# Patient Record
Sex: Female | Born: 1970 | Race: White | Hispanic: No | Smoking: Never smoker
Health system: Southern US, Community
[De-identification: ages and names within clinical notes are randomized; demographics above are authoritative.]

## PROBLEM LIST (undated history)

## (undated) DIAGNOSIS — I10 Essential (primary) hypertension: Secondary | ICD-10-CM

## (undated) DIAGNOSIS — T7840XA Allergy, unspecified, initial encounter: Secondary | ICD-10-CM

## (undated) HISTORY — DX: Essential (primary) hypertension: I10

## (undated) HISTORY — PX: TONSILECTOMY, ADENOIDECTOMY, BILATERAL MYRINGOTOMY AND TUBES: SHX2538

## (undated) HISTORY — DX: Allergy, unspecified, initial encounter: T78.40XA

---

## 2012-03-25 ENCOUNTER — Encounter: Payer: Self-pay | Admitting: Physician Assistant

## 2012-03-25 ENCOUNTER — Ambulatory Visit (INDEPENDENT_AMBULATORY_CARE_PROVIDER_SITE_OTHER): Payer: 59 | Admitting: Physician Assistant

## 2012-03-25 VITALS — BP 135/88 | HR 74 | Temp 98.0°F | Resp 16 | Ht 64.5 in | Wt 283.0 lb

## 2012-03-25 DIAGNOSIS — Z23 Encounter for immunization: Secondary | ICD-10-CM

## 2012-03-25 DIAGNOSIS — I10 Essential (primary) hypertension: Secondary | ICD-10-CM

## 2012-03-25 DIAGNOSIS — Z0000 Encounter for general adult medical examination without abnormal findings: Secondary | ICD-10-CM

## 2012-03-25 DIAGNOSIS — Z01419 Encounter for gynecological examination (general) (routine) without abnormal findings: Secondary | ICD-10-CM

## 2012-03-25 LAB — CBC WITH DIFFERENTIAL/PLATELET
Eosinophils Relative: 3 % (ref 0–5)
HCT: 43.2 % (ref 36.0–46.0)
Hemoglobin: 13.8 g/dL (ref 12.0–15.0)
Lymphocytes Relative: 30 % (ref 12–46)
Lymphs Abs: 2.3 10*3/uL (ref 0.7–4.0)
MCV: 85 fL (ref 78.0–100.0)
Monocytes Absolute: 0.4 10*3/uL (ref 0.1–1.0)
Monocytes Relative: 6 % (ref 3–12)
RBC: 5.08 MIL/uL (ref 3.87–5.11)
WBC: 7.8 10*3/uL (ref 4.0–10.5)

## 2012-03-25 LAB — POCT URINALYSIS DIPSTICK
Bilirubin, UA: NEGATIVE
Glucose, UA: NEGATIVE
Leukocytes, UA: NEGATIVE
Nitrite, UA: NEGATIVE
Urobilinogen, UA: 0.2

## 2012-03-25 LAB — TSH: TSH: 3.487 u[IU]/mL (ref 0.350–4.500)

## 2012-03-25 LAB — COMPREHENSIVE METABOLIC PANEL
ALT: 12 U/L (ref 0–35)
BUN: 10 mg/dL (ref 6–23)
CO2: 25 mEq/L (ref 19–32)
Calcium: 8.8 mg/dL (ref 8.4–10.5)
Chloride: 103 mEq/L (ref 96–112)
Creat: 0.95 mg/dL (ref 0.50–1.10)
Glucose, Bld: 83 mg/dL (ref 70–99)

## 2012-03-25 LAB — LIPID PANEL: HDL: 39 mg/dL — ABNORMAL LOW (ref >39)

## 2012-03-25 MED ORDER — LEVONORGESTREL-ETHINYL ESTRAD 0.1-20 MG-MCG PO TABS: 1.0000 | ORAL_TABLET | Freq: Every day | ORAL | Status: DC

## 2012-03-25 MED ORDER — ATENOLOL 50 MG PO TABS: 50.0000 mg | ORAL_TABLET | Freq: Every day | ORAL | Status: DC

## 2012-03-25 MED ORDER — ALPRAZOLAM 0.25 MG PO TABS: 0.2500 mg | ORAL_TABLET | Freq: Every evening | ORAL | Status: DC | PRN

## 2012-03-25 NOTE — Progress Notes (Signed)
  Subjective:    Patient ID: Jamie Marks, female    DOB: May 14, 1971, 41 y.o.   MRN: [id number]  HPI 41 yo CF here for CPE.  She has a fear of thunderstorms and needs a xanax rx which she uses very infrequently.  C/o hot flashes and irritability.  See scanned in form.  Review of Systems  All other systems reviewed and are negative.       Objective:   Physical Exam  Nursing note and vitals reviewed. Constitutional: She is oriented to person, place, and time. She appears well-developed and well-nourished.  HENT:  Head: Normocephalic and atraumatic.  Eyes: Conjunctivae and EOM are normal. Pupils are equal, round, and reactive to light. No scleral icterus.  Neck: Normal range of motion. Neck supple. No thyromegaly present.  Cardiovascular: Normal rate, regular rhythm, normal heart sounds and intact distal pulses.   Pulmonary/Chest: Effort normal and breath sounds normal. Right breast exhibits no inverted nipple, no mass, no nipple discharge, no skin change and no tenderness. Left breast exhibits no inverted nipple, no mass, no nipple discharge, no skin change and no tenderness.  Abdominal: Soft.  Genitourinary: Vagina normal and uterus normal. No vaginal discharge found.       Bimanual unremarkable  Musculoskeletal: Normal range of motion.  Lymphadenopathy:    She has no cervical adenopathy.  Neurological: She is alert and oriented to person, place, and time. She displays normal reflexes. No cranial nerve deficit. Coordination normal.  Skin: Skin is warm and dry.  Psychiatric: She has a normal mood and affect. Her behavior is normal.          Assessment & Plan:  CPE Gyn exam-no sex in 5 yrs.  Never had abnormal pap.  Repeat pap in 3 yrs if normal. Obesity-advised healthy diet and exercise.  Start SBE.  She is having MMG every 2 years  Advised considering SSRI or effexor to see if it would help decrease her irritability, but at this time she doesn't want to pursue that.

## 2012-03-26 ENCOUNTER — Encounter: Payer: Self-pay | Admitting: Physician Assistant

## 2012-03-26 DIAGNOSIS — I10 Essential (primary) hypertension: Secondary | ICD-10-CM | POA: Insufficient documentation

## 2012-03-26 LAB — VITAMIN D 25 HYDROXY (VIT D DEFICIENCY, FRACTURES): Vit D, 25-Hydroxy: 44 ng/mL (ref 30–89)

## 2012-03-30 LAB — PAP IG W/ RFLX HPV ASCU

## 2013-02-23 LAB — HM MAMMOGRAPHY: HM Mammogram: NEGATIVE

## 2013-03-02 ENCOUNTER — Encounter: Payer: Self-pay | Admitting: Family Medicine

## 2013-04-23 ENCOUNTER — Other Ambulatory Visit: Payer: Self-pay | Admitting: Physician Assistant

## 2013-04-30 ENCOUNTER — Encounter: Payer: Self-pay | Admitting: Family Medicine

## 2013-04-30 ENCOUNTER — Ambulatory Visit (INDEPENDENT_AMBULATORY_CARE_PROVIDER_SITE_OTHER): Payer: 59 | Admitting: Family Medicine

## 2013-04-30 VITALS — BP 126/84 | HR 63 | Temp 99.0°F | Resp 16 | Ht 64.5 in | Wt 279.8 lb

## 2013-04-30 DIAGNOSIS — Z3009 Encounter for other general counseling and advice on contraception: Secondary | ICD-10-CM

## 2013-04-30 DIAGNOSIS — I10 Essential (primary) hypertension: Secondary | ICD-10-CM

## 2013-04-30 DIAGNOSIS — Z0000 Encounter for general adult medical examination without abnormal findings: Secondary | ICD-10-CM

## 2013-04-30 DIAGNOSIS — Z79899 Other long term (current) drug therapy: Secondary | ICD-10-CM

## 2013-04-30 LAB — TSH: TSH: 1.539 u[IU]/mL (ref 0.350–4.500)

## 2013-04-30 LAB — COMPREHENSIVE METABOLIC PANEL
Albumin: 3.7 g/dL (ref 3.5–5.2)
Alkaline Phosphatase: 73 U/L (ref 39–117)
BUN: 9 mg/dL (ref 6–23)
CO2: 24 mEq/L (ref 19–32)
Glucose, Bld: 86 mg/dL (ref 70–99)
Sodium: 140 mEq/L (ref 135–145)
Total Bilirubin: 0.4 mg/dL (ref 0.3–1.2)
Total Protein: 6.6 g/dL (ref 6.0–8.3)

## 2013-04-30 LAB — POCT URINALYSIS DIPSTICK
Bilirubin, UA: NEGATIVE
Glucose, UA: NEGATIVE
Ketones, UA: NEGATIVE
Nitrite, UA: NEGATIVE
pH, UA: 6.5

## 2013-04-30 LAB — CBC WITH DIFFERENTIAL/PLATELET
Basophils Absolute: 0 10*3/uL (ref 0.0–0.1)
Basophils Relative: 0 % (ref 0–1)
Hemoglobin: 14.2 g/dL (ref 12.0–15.0)
Lymphocytes Relative: 24 % (ref 12–46)
MCHC: 34.7 g/dL (ref 30.0–36.0)
Neutro Abs: 6.2 10*3/uL (ref 1.7–7.7)
Neutrophils Relative %: 69 % (ref 43–77)
RDW: 14.4 % (ref 11.5–15.5)
WBC: 9 10*3/uL (ref 4.0–10.5)

## 2013-04-30 LAB — LIPID PANEL
Cholesterol: 181 mg/dL (ref 0–200)
HDL: 44 mg/dL (ref >39)
Total CHOL/HDL Ratio: 4.1 Ratio

## 2013-04-30 MED ORDER — ATENOLOL 50 MG PO TABS: 50.0000 mg | ORAL_TABLET | Freq: Every day | ORAL | Status: DC

## 2013-04-30 MED ORDER — LEVONORGESTREL-ETHINYL ESTRAD 0.1-20 MG-MCG PO TABS: 1.0000 | ORAL_TABLET | Freq: Every day | ORAL | Status: DC

## 2013-04-30 MED ORDER — ALPRAZOLAM 0.5 MG PO TABS: 0.5000 mg | ORAL_TABLET | Freq: Every evening | ORAL | Status: DC | PRN

## 2013-04-30 NOTE — Progress Notes (Signed)
Subjective:    Patient ID: Jamie Marks, female    DOB: [birth year], 42 y.o.   MRN: [id number] Chief Complaint  Patient presents with  . Annual Exam    w/pap   HPI  Has not had a period since the end of 2009 - occ spots when her periods are due Has been on birth control to regulate her hormones. She had been seeing a gynecologist prior for her pelvics. Had an abnormal pap smear in her early 67s - pre-20s and they were frozen off and all have been normal since.  Has had a mammogram before - first was a 65 and she just had another.  Calcium and vit D occ but forgetful -   Past Medical History  Diagnosis Date  . Allergy    Current Outpatient Prescriptions on File Prior to Visit  Medication Sig Dispense Refill  . calcium-vitamin D (OSCAL WITH D) 500-200 MG-UNIT per tablet Take 1 tablet by mouth daily.       No current facility-administered medications on file prior to visit.   No Known Allergies History reviewed. No pertinent past surgical history. History reviewed. No pertinent family history. History   Social History  . Marital Status: Single    Spouse Name: N/A    Number of Children: N/A  . Years of Education: N/A   Occupational History  . Data Entry/Customer Service    Social History Main Topics  . Smoking status: Never Smoker   . Smokeless tobacco: None  . Alcohol Use: No  . Drug Use: No  . Sexual Activity: None   Other Topics Concern  . None   Social History Narrative   Divorced. Education: [employer].     Review of Systems  Constitutional: Positive for fatigue.  HENT: Negative.   Eyes: Negative.   Respiratory: Negative.   Cardiovascular: Negative.   Gastrointestinal: Negative.   Endocrine: Negative.   Genitourinary: Negative.   Musculoskeletal: Negative.   Skin: Negative.   Allergic/Immunologic: Negative.   Neurological: Negative.   Hematological: Negative.   Psychiatric/Behavioral: Negative.       BP 126/84  Pulse 63  Temp(Src) 99 F (37.2  C) (Oral)  Resp 16  Ht 5' 4.5" (1.638 m)  Wt 279 lb 12.8 oz (126.916 kg)  BMI 47.3 kg/m2  SpO2 98%  LMP 08/18/2008 Objective:   Physical Exam  Constitutional: She is oriented to person, place, and time. She appears well-developed and well-nourished. No distress.  HENT:  Head: Normocephalic and atraumatic.  Right Ear: Tympanic membrane, external ear and ear canal normal.  Left Ear: Tympanic membrane, external ear and ear canal normal.  Nose: Mucosal edema present. No rhinorrhea.  Mouth/Throat: Uvula is midline, oropharynx is clear and moist and mucous membranes are normal. No posterior oropharyngeal erythema.  Eyes: Conjunctivae and EOM are normal. Pupils are equal, round, and reactive to light. Right eye exhibits no discharge. Left eye exhibits no discharge. No scleral icterus.  Neck: Normal range of motion. Neck supple. No thyromegaly present.  Cardiovascular: Normal rate, regular rhythm, normal heart sounds and intact distal pulses.   Pulmonary/Chest: Effort normal and breath sounds normal. No respiratory distress.  Abdominal: Soft. Bowel sounds are normal. There is no tenderness.  Genitourinary: Vagina normal and uterus normal. No breast swelling, tenderness, discharge or bleeding. Cervix exhibits no motion tenderness and no friability. Right adnexum displays no mass and no tenderness. Left adnexum displays no mass and no tenderness.  Musculoskeletal: She exhibits no edema.  Lymphadenopathy:  She has no cervical adenopathy.  Neurological: She is alert and oriented to person, place, and time. She has normal reflexes.  Skin: Skin is warm and dry. She is not diaphoretic. No erythema.  Psychiatric: She has a normal mood and affect. Her behavior is normal.          Assessment & Plan:  Amenorrhea - could be due to OCPs or peri-menopausal but pt does not want to go off the pills to find out as she is stable hormonally.  Continue on yearly mammograms of [facility].    Routine general  medical examination at a health care facility - Plan: Pap IG and HPV (high risk) DNA detection, POCT urinalysis dipstick, Lipid panel, Comprehensive metabolic panel, Vitamin D 25 hydroxy, TSH, CBC with Differential  HTN (hypertension)  Encounter for long-term (current) use of other medications  Other general counseling and advice for contraceptive management - Plan: Pap IG and HPV (high risk) DNA detection  Meds ordered this encounter  Medications  . OVER THE COUNTER MEDICATION    Sig: OTC Allergy-Tec taking  . OVER THE COUNTER MEDICATION    Sig: OTC Sublingual  B 12 2500 mcg taking  . ALPRAZolam (XANAX) 0.5 MG tablet    Sig: Take 1 tablet (0.5 mg total) by mouth at bedtime as needed for anxiety.    Dispense:  30 tablet    Refill:  0  . atenolol (TENORMIN) 50 MG tablet    Sig: Take 1 tablet (50 mg total) by mouth daily.    Dispense:  30 tablet    Refill:  11    .  . levonorgestrel-ethinyl estradiol (AVIANE,ALESSE,LESSINA) 0.1-20 MG-MCG tablet    Sig: Take 1 tablet by mouth daily.    Dispense:  3 Package    Refill:  4

## 2013-04-30 NOTE — Patient Instructions (Signed)

## 2013-05-01 LAB — VITAMIN D 25 HYDROXY (VIT D DEFICIENCY, FRACTURES): Vit D, 25-Hydroxy: 52 ng/mL (ref 30–89)

## 2013-05-03 LAB — PAP IG AND HPV HIGH-RISK

## 2013-05-04 ENCOUNTER — Encounter: Payer: Self-pay | Admitting: Radiology

## 2013-05-15 ENCOUNTER — Other Ambulatory Visit: Payer: Self-pay | Admitting: Physician Assistant

## 2013-09-02 ENCOUNTER — Encounter: Payer: Self-pay | Admitting: *Deleted

## 2014-04-28 ENCOUNTER — Other Ambulatory Visit: Payer: Self-pay | Admitting: Family Medicine

## 2014-05-06 ENCOUNTER — Encounter: Payer: Self-pay | Admitting: Family Medicine

## 2014-05-06 ENCOUNTER — Ambulatory Visit (INDEPENDENT_AMBULATORY_CARE_PROVIDER_SITE_OTHER): Payer: 59 | Admitting: Family Medicine

## 2014-05-06 VITALS — BP 140/82 | HR 82 | Temp 98.9°F | Resp 16 | Ht 64.75 in | Wt 292.8 lb

## 2014-05-06 DIAGNOSIS — Z124 Encounter for screening for malignant neoplasm of cervix: Secondary | ICD-10-CM

## 2014-05-06 DIAGNOSIS — Z0000 Encounter for general adult medical examination without abnormal findings: Secondary | ICD-10-CM

## 2014-05-06 LAB — CBC WITH DIFFERENTIAL/PLATELET
BASOS PCT: 0 % (ref 0–1)
Basophils Absolute: 0 10*3/uL (ref 0.0–0.1)
EOS PCT: 2 % (ref 0–5)
Eosinophils Absolute: 0.2 10*3/uL (ref 0.0–0.7)
HEMATOCRIT: 43.7 % (ref 36.0–46.0)
Hemoglobin: 14.8 g/dL (ref 12.0–15.0)
Lymphocytes Relative: 25 % (ref 12–46)
Lymphs Abs: 2.5 10*3/uL (ref 0.7–4.0)
MCH: 28 pg (ref 26.0–34.0)
MCHC: 33.9 g/dL (ref 30.0–36.0)
MCV: 82.8 fL (ref 78.0–100.0)
MONO ABS: 0.5 10*3/uL (ref 0.1–1.0)
Monocytes Relative: 5 % (ref 3–12)
Neutro Abs: 6.8 10*3/uL (ref 1.7–7.7)
Neutrophils Relative %: 68 % (ref 43–77)
Platelets: 369 10*3/uL (ref 150–400)
RBC: 5.28 MIL/uL — ABNORMAL HIGH (ref 3.87–5.11)
RDW: 14.2 % (ref 11.5–15.5)
WBC: 10 10*3/uL (ref 4.0–10.5)

## 2014-05-06 MED ORDER — ALPRAZOLAM 0.5 MG PO TABS: 0.5000 mg | ORAL_TABLET | Freq: Every evening | ORAL | Status: DC | PRN

## 2014-05-06 MED ORDER — ATENOLOL 50 MG PO TABS: 50.0000 mg | ORAL_TABLET | Freq: Every day | ORAL | Status: DC

## 2014-05-06 NOTE — Patient Instructions (Signed)
Keeping You Healthy  Get These Tests 1. Blood Pressure- Have your blood pressure checked once a year by your health care provider.  Normal blood pressure is 120/80. 2. Weight- Have your body mass index (BMI) calculated to screen for obesity.  BMI is measure of body fat based on height and weight.  You can also calculate your own BMI at https://www.west-esparza.com/www.nhlbisupport.com/bmi/. 3. Cholesterol- Have your cholesterol checked every 5 years starting at age 45 then yearly starting at age 45. 4. Chlamydia, HIV, and other sexually transmitted diseases- Get screened every year until age 45, then within three months of each new sexual provider. 5. Pap Smear- Every 1-3 years; discuss with your health care provider. 6. Mammogram- Every year starting at age 45  Take these medicines  Calcium with Vitamin D-Your body needs 1200 mg of Calcium each day and [phone] IU of Vitamin D daily.  Your body can only absorb 500 mg of Calcium at a time so Calcium must be taken in 2 or 3 divided doses throughout the day.  Multivitamin with folic acid- Once daily if it is possible for you to become pregnant.  Get these Immunizations  Gardasil-Series of three doses; prevents HPV related illness such as genital warts and cervical cancer.  Menactra-Single dose; prevents meningitis.  Tetanus shot- Every 10 years.  Flu shot-Every year.  Take these steps 1. Do not smoke-Your healthcare provider can help you quit.  For tips on how to quit go to www.smokefree.gov or call 1-800 QUITNOW. 2. Be physically active- Exercise 5 days a week for at least 30 minutes.  If you are not already physically active, start slow and gradually work up to 30 minutes of moderate physical activity.  Examples of moderate activity include walking briskly, dancing, swimming, bicycling, etc. 3. Breast Cancer- A self breast exam every month is important for early detection of breast cancer.  For more information and instruction on self breast exams, ask your  healthcare provider or [place].eswww.womenshealth.gov/faq/breast-self-exam.cfm. 4. Eat a healthy diet- Eat a variety of healthy foods such as fruits, vegetables, whole grains, low fat milk, low fat cheeses, yogurt, lean meats, poultry and fish, beans, nuts, tofu, etc.  For more information go to www. Thenutritionsource.org 5. Drink alcohol in moderation- Limit alcohol intake to one drink or less per day. Never drink and drive. 6. Depression- Your emotional health is as important as your physical health.  If you're feeling down or losing interest in things you normally enjoy please talk to your healthcare provider about being screened for depression. 7. Dental visit- Brush and floss your teeth twice daily; visit your dentist twice a year. 8. Eye doctor- Get an eye exam at least every 2 years. 9. Helmet use- Always wear a helmet when riding a bicycle, motorcycle, rollerblading or skateboarding. 10. Safe sex- If you may be exposed to sexually transmitted infections, use a condom. 11. Seat belts- Seat belts can save your live; always wear one. 12. Smoke/Carbon Monoxide detectors- These detectors need to be installed on the appropriate level of your home. Replace batteries at least once a year. 13. Skin cancer- When out in the sun please cover up and use sunscreen 15 SPF or higher. 14. Violence- If anyone is threatening or hurting you, please tell your healthcare provider.     Hemorrhoids Hemorrhoids are swollen veins around the rectum or anus. There are two types of hemorrhoids:   Internal hemorrhoids. These occur in the veins just inside the rectum. They may poke through to the outside  and become irritated and painful.  External hemorrhoids. These occur in the veins outside the anus and can be felt as a painful swelling or hard lump near the anus. CAUSES  Pregnancy.   Obesity.   Constipation or diarrhea.   Straining to have a bowel movement.   Sitting for long periods on the toilet.  Heavy  lifting or other activity that caused you to strain.  Anal intercourse. SYMPTOMS   Pain.   Anal itching or irritation.   Rectal bleeding.   Fecal leakage.   Anal swelling.   One or more lumps around the anus.  DIAGNOSIS  Your caregiver may be able to diagnose hemorrhoids by visual examination. Other examinations or tests that may be performed include:   Examination of the rectal area with a gloved hand (digital rectal exam).   Examination of anal canal using a small tube (scope).   A blood test if you have lost a significant amount of blood.  A test to look inside the colon (sigmoidoscopy or colonoscopy). TREATMENT Most hemorrhoids can be treated at home. However, if symptoms do not seem to be getting better or if you have a lot of rectal bleeding, your caregiver may perform a procedure to help make the hemorrhoids get smaller or remove them completely. Possible treatments include:   Placing a rubber band at the base of the hemorrhoid to cut off the circulation (rubber band ligation).   Injecting a chemical to shrink the hemorrhoid (sclerotherapy).   Using a tool to burn the hemorrhoid (infrared light therapy).   Surgically removing the hemorrhoid (hemorrhoidectomy).   Stapling the hemorrhoid to block blood flow to the tissue (hemorrhoid stapling).  HOME CARE INSTRUCTIONS   Eat foods with fiber, such as whole grains, beans, nuts, fruits, and vegetables. Ask your doctor about taking products with added fiber in them (fibersupplements).  Increase fluid intake. Drink enough water and fluids to keep your urine clear or pale yellow.   Exercise regularly.   Go to the bathroom when you have the urge to have a bowel movement. Do not wait.   Avoid straining to have bowel movements.   Keep the anal area dry and clean. Use wet toilet paper or moist towelettes after a bowel movement.   Medicated creams and suppositories may be used or applied as directed.    Only take over-the-counter or prescription medicines as directed by your caregiver.   Take warm sitz baths for 15-20 minutes, 3-4 times a day to ease pain and discomfort.   Place ice packs on the hemorrhoids if they are tender and swollen. Using ice packs between sitz baths may be helpful.   Put ice in a plastic bag.   Place a towel between your skin and the bag.   Leave the ice on for 15-20 minutes, 3-4 times a day.   Do not use a donut-shaped pillow or sit on the toilet for long periods. This increases blood pooling and pain.  SEEK MEDICAL CARE IF:  You have increasing pain and swelling that is not controlled by treatment or medicine.  You have uncontrolled bleeding.  You have difficulty or you are unable to have a bowel movement.  You have pain or inflammation outside the area of the hemorrhoids. MAKE SURE YOU:  Understand these instructions.  Will watch your condition.  Will get help right away if you are not doing well or get worse. Document Released: 11/01/2000 Document Revised: 10/21/2012 Document Reviewed: 09/08/2012 [facility] Patient Information 2015 [place] [place].  This information is not intended to replace advice given to you by your health care provider. Make sure you discuss any questions you have with your health care provider.

## 2014-05-06 NOTE — Progress Notes (Deleted)
This chart was scribed for Jamie MochaEva N Shaw, MD by Luisa DagoPriscilla Tutu, ED Scribe. This patient was seen in room 25 and the patient's care was started at 2:04 PM.  Subjective:    Patient ID: Jamie Marks, female    DOB: Oct 06, 1971, 43 y.o.   MRN: [id number]  Chief Complaint  Patient presents with   Annual Exam    W/PAP?    HPI HPI Comments: Jamie LawsDeborah Marks is a 43 y.o. female who presents to the [facility] for her annual physical exam.  Pt was last seen on 05/06/2013 by me. She has had no regular periods since 2009 but occasionally has spotting. Pt has been on birth control to regulate her hormones. She did have an abnomal pap smear in the 20's and was treated with surgery. Pt has not had any abnormal pap smears since then. However, she prefers to continue with annual pap smears due to her hx despite changing recs. Mammograms every 2 years since she turned 38. She is on Atenolol daily for her BP and takes Xanax for anxiety as needed but has used less than 30 this past year. Full lab work up with lipids, vitamin D,TSH, CBC, and CMP last yr were all within normal limits. Pt's last tentanus vaccine was in 2013.   Today, pt states that she is doing well today. She states that she is not sexually active in the past 7 years.   Pt states that she has ridges in her fingernails and is concerned. She is concerned that she may be low on calcium. Pt reassured but should take vit D and ca supp daily or get in diet.   Pt states that as she's gotten older she's had regular bowel movements. She states that she goes in the morning and before bed. Pt is concerned that she may still be constipated, because she had a conversation with a friend who told her that "you could still be constipated, even it you are going to the bathroom regularly". Pt states that she's added a stool softener to her diet as needed. She states that sometime ago she had a straining episodes and sometimes when she wipes she  sees some blood.   Pt does not check her BP regularly. She states that she still takes her Atenolol every night.   Patient Active Problem List   Diagnosis Date Noted   HTN (hypertension) 03/26/2012   Past Medical History  Diagnosis Date   Allergy    No past surgical history on file. No Known Allergies Prior to Admission medications   Medication Sig Start Date End Date Taking? Authorizing Provider  ALPRAZolam Prudy Feeler(XANAX) 0.5 MG tablet Take 1 tablet (0.5 mg total) by mouth at bedtime as needed for anxiety. 04/30/13   Jamie MochaEva N Shaw, MD  atenolol (TENORMIN) 50 MG tablet Take 1 tablet (50 mg total) by mouth daily. PATIENT NEEDS OFFICE VISIT FOR ADDITIONAL REFILLS 04/28/14   Jamie MochaEva N Shaw, MD  calcium-vitamin D (OSCAL WITH D) 500-200 MG-UNIT per tablet Take 1 tablet by mouth daily.    Historical Provider, MD  levonorgestrel-ethinyl estradiol (AVIANE,ALESSE,LESSINA) 0.1-20 MG-MCG tablet Take 1 tablet by mouth daily. 04/30/13   Jamie MochaEva N Shaw, MD  OVER THE COUNTER MEDICATION OTC Sublingual  B 12 2500 mcg taking    Historical Provider, MD   History   Social History   Marital Status: Single    Spouse Name: N/A    Number of Children: N/A   Years of Education:  N/A   Occupational History   Data Entry/Customer Service    Social History Main Topics   Smoking status: Never Smoker    Smokeless tobacco: Not on file   Alcohol Use: No   Drug Use: No   Sexual Activity: Not on file   Other Topics Concern   Not on file   Social History Narrative   Divorced. Education: [employer].     Review of Systems  Constitutional: Negative for fatigue and unexpected weight change.  Respiratory: Negative for chest tightness and shortness of breath.   Cardiovascular: Negative for chest pain, palpitations and leg swelling.  Gastrointestinal: Negative for abdominal pain and blood in stool.  Neurological: Negative for dizziness, syncope, light-headedness and headaches.  All other systems reviewed and are  negative.      Objective:   Physical Exam  Nursing note and vitals reviewed. Constitutional: She is oriented to person, place, and time. She appears well-developed and well-nourished. No distress.  HENT:  Head: Normocephalic and atraumatic.  Right Ear: Tympanic membrane normal.  Left Ear: Tympanic membrane normal.  Nose: Nose normal.  Mouth/Throat: Uvula is midline, oropharynx is clear and moist and mucous membranes are normal.  Eyes: Conjunctivae and EOM are normal.  Neck: Normal range of motion. Neck supple. No thyromegaly present.  Cardiovascular: Normal rate, regular rhythm and normal heart sounds.  Exam reveals no gallop and no friction rub.   No murmur heard. Pulmonary/Chest: Effort normal. No respiratory distress. She has no wheezes. She has no rales. She exhibits no tenderness.  Genitourinary: Rectum normal, vagina normal and uterus normal. No breast swelling or tenderness. Pelvic exam was performed with patient supine. There is no tenderness on the right labia. There is no tenderness on the left labia. No vaginal discharge found.  Normal labia. Normal cervix. Normal vagina. Normal uterus. Small healing external hemorrhoid at 4 o'clock. Rectal exam normal.   Musculoskeletal: Normal range of motion.  Lymphadenopathy:    She has no cervical adenopathy.  Neurological: She is alert and oriented to person, place, and time.  Skin: Skin is warm and dry.  Psychiatric: She has a normal mood and affect. Her behavior is normal.   Filed Vitals:   05/06/14 1351  BP: 140/82  Pulse: 82  Temp: 98.9 F (37.2 C)  TempSrc: Oral  Resp: 16  Height: 5' 4.75" (1.645 m)  Weight: 292 lb 12.8 oz (132.813 kg)  SpO2: 99%          Assessment & Plan:   Routine general medical examination at a health care facility - Plan: CBC with Differential, TSH, Lipid panel, Comprehensive metabolic panel  Pap smear for cervical cancer screening - Plan: Pap IG w/ reflex to HPV when ASC-U  Meds ordered  this encounter  Medications   cetirizine (ZYRTEC) 10 MG tablet    Sig: Take 10 mg by mouth daily.   ALPRAZolam (XANAX) 0.5 MG tablet    Sig: Take 1 tablet (0.5 mg total) by mouth at bedtime as needed for anxiety.    Dispense:  30 tablet    Refill:  0   atenolol (TENORMIN) 50 MG tablet    Sig: Take 1 tablet (50 mg total) by mouth daily.    Dispense:  90 tablet    Refill:  3    I personally performed the services described in this documentation, which was scribed in my presence. The recorded information has been reviewed and considered, and addended by me as needed.  Norberto SorensonEva Shaw, MD MPH

## 2014-05-07 LAB — COMPREHENSIVE METABOLIC PANEL
ALBUMIN: 3.9 g/dL (ref 3.5–5.2)
ALK PHOS: 85 U/L (ref 39–117)
ALT: 13 U/L (ref 0–35)
AST: 16 U/L (ref 0–37)
BUN: 7 mg/dL (ref 6–23)
CALCIUM: 9.2 mg/dL (ref 8.4–10.5)
CHLORIDE: 102 meq/L (ref 96–112)
CO2: 26 mEq/L (ref 19–32)
Creat: 0.84 mg/dL (ref 0.50–1.10)
Glucose, Bld: 81 mg/dL (ref 70–99)
POTASSIUM: 4.5 meq/L (ref 3.5–5.3)
Sodium: 138 mEq/L (ref 135–145)
Total Bilirubin: 0.4 mg/dL (ref 0.2–1.2)
Total Protein: 6.9 g/dL (ref 6.0–8.3)

## 2014-05-07 LAB — LIPID PANEL
Cholesterol: 156 mg/dL (ref 0–200)
HDL: 47 mg/dL (ref >39)
LDL CALC: 93 mg/dL (ref 0–99)
Total CHOL/HDL Ratio: 3.3 Ratio
Triglycerides: 81 mg/dL (ref <150)
VLDL: 16 mg/dL (ref 0–40)

## 2014-05-07 LAB — TSH: TSH: 3.114 u[IU]/mL (ref 0.350–4.500)

## 2014-05-10 LAB — PAP IG W/ RFLX HPV ASCU

## 2014-05-16 ENCOUNTER — Telehealth: Payer: Self-pay | Admitting: *Deleted

## 2014-05-16 NOTE — Telephone Encounter (Signed)
Pt called in this morning in regards to lab results from 6/19. Please advise.

## 2014-05-16 NOTE — Telephone Encounter (Signed)
Pt called in regards to results from labs on 6/19. Please advise

## 2014-05-18 ENCOUNTER — Encounter: Payer: Self-pay | Admitting: Family Medicine

## 2014-05-18 NOTE — Telephone Encounter (Signed)
Labs normal, letter sent and report sent to lab pool.

## 2014-05-18 NOTE — Progress Notes (Signed)
This chart was scribed for Sherren Mocha, MD by Luisa Dago, ED Scribe. This patient was seen in room 25 and the patient's care was started at 2:04 PM.  Subjective:    Patient ID: Jamie Marks, female    DOB: 03-17-1971, 43 y.o.   MRN: [id number]  Chief Complaint  Patient presents with  . Annual Exam    W/PAP?    HPI HPI Comments: Jamie Marks is a 43 y.o. female who presents to the [facility] for her annual physical exam.  Pt was last seen on 05/06/2013 by me. She has had no regular periods since 2009 but occasionally has spotting. Pt has been on birth control to regulate her hormones. She did have an abnomal pap smear in the 20's and was treated with surgery. Pt has not had any abnormal pap smears since then. However, she prefers to continue with annual pap smears due to her hx despite changing recs. Mammograms every 2 years since she turned 38. She is on Atenolol daily for her BP and takes Xanax for anxiety as needed but has used less than 30 this past year. Full lab work up with lipids, vitamin D,TSH, CBC, and CMP last yr were all within normal limits. Pt's last tentanus vaccine was in 2013.   Today, pt states that she is doing well today. She states that she is not sexually active in the past 7 years.   Pt states that she has ridges in her fingernails and is concerned. She is concerned that she may be low on calcium. Pt reassured but should take vit D and ca supp daily or get in diet.   Pt states that as she's gotten older she's had regular bowel movements. She states that she goes in the morning and before bed. Pt is concerned that she may still be constipated, because she had a conversation with a friend who told her that "you could still be constipated, even it you are going to the bathroom regularly". Pt states that she's added a stool softener to her diet as needed. She states that sometime ago she had a straining episodes and sometimes when she wipes she  sees some blood.   Pt does not check her BP regularly. She states that she still takes her Atenolol every night.   Patient Active Problem List   Diagnosis Date Noted  . HTN (hypertension) 03/26/2012   Past Medical History  Diagnosis Date  . Allergy   . Hypertension    Past Surgical History  Procedure Laterality Date  . Tonsilectomy, adenoidectomy, bilateral myringotomy and tubes     No Known Allergies Prior to Admission medications   Medication Sig Start Date End Date Taking? Authorizing Provider  ALPRAZolam Prudy Feeler) 0.5 MG tablet Take 1 tablet (0.5 mg total) by mouth at bedtime as needed for anxiety. 04/30/13   Sherren Mocha, MD  atenolol (TENORMIN) 50 MG tablet Take 1 tablet (50 mg total) by mouth daily. PATIENT NEEDS OFFICE VISIT FOR ADDITIONAL REFILLS 04/28/14   Sherren Mocha, MD  calcium-vitamin D (OSCAL WITH D) 500-200 MG-UNIT per tablet Take 1 tablet by mouth daily.    Historical Provider, MD  levonorgestrel-ethinyl estradiol (AVIANE,ALESSE,LESSINA) 0.1-20 MG-MCG tablet Take 1 tablet by mouth daily. 04/30/13   Sherren Mocha, MD  OVER THE COUNTER MEDICATION OTC Sublingual  B 12 2500 mcg taking    Historical Provider, MD   History   Social History  . Marital Status: Single  Spouse Name: N/A    Number of Children: N/A  . Years of Education: N/A   Occupational History  . Data Entry/Customer Service    Social History Main Topics  . Smoking status: Never Smoker   . Smokeless tobacco: Not on file  . Alcohol Use: No  . Drug Use: No  . Sexual Activity: Not on file   Other Topics Concern  . Not on file   Social History Narrative   Divorced. Education: [employer].     Review of Systems  Constitutional: Negative for fatigue and unexpected weight change.  Respiratory: Negative for chest tightness and shortness of breath.   Cardiovascular: Negative for chest pain, palpitations and leg swelling.  Gastrointestinal: Negative for abdominal pain and blood in stool.  Neurological:  Negative for dizziness, syncope, light-headedness and headaches.  All other systems reviewed and are negative.      Objective:   Physical Exam  Nursing note and vitals reviewed. Constitutional: She is oriented to person, place, and time. She appears well-developed and well-nourished. No distress.  HENT:  Head: Normocephalic and atraumatic.  Right Ear: Tympanic membrane normal.  Left Ear: Tympanic membrane normal.  Nose: Nose normal.  Mouth/Throat: Uvula is midline, oropharynx is clear and moist and mucous membranes are normal.  Eyes: Conjunctivae and EOM are normal.  Neck: Normal range of motion. Neck supple. No thyromegaly present.  Cardiovascular: Normal rate, regular rhythm and normal heart sounds.  Exam reveals no gallop and no friction rub.   No murmur heard. Pulmonary/Chest: Effort normal. No respiratory distress. She has no wheezes. She has no rales. She exhibits no tenderness.  Genitourinary: Rectum normal, vagina normal and uterus normal. No breast swelling or tenderness. Pelvic exam was performed with patient supine. There is no tenderness on the right labia. There is no tenderness on the left labia. No vaginal discharge found.  Normal labia. Normal cervix. Normal vagina. Normal uterus. Small healing external hemorrhoid at 4 o'clock. Rectal exam normal.   Musculoskeletal: Normal range of motion.  Lymphadenopathy:    She has no cervical adenopathy.  Neurological: She is alert and oriented to person, place, and time.  Skin: Skin is warm and dry.  Psychiatric: She has a normal mood and affect. Her behavior is normal.   Filed Vitals:   05/06/14 1351  BP: 140/82  Pulse: 82  Temp: 98.9 F (37.2 C)  TempSrc: Oral  Resp: 16  Height: 5' 4.75" (1.645 m)  Weight: 292 lb 12.8 oz (132.813 kg)  SpO2: 99%          Assessment & Plan:   Routine general medical examination at a health care facility - Plan: CBC with Differential, TSH, Lipid panel, Comprehensive metabolic  panel  Pap smear for cervical cancer screening - Plan: Pap IG w/ reflex to HPV when ASC-U Ok to cont w/ annual pap per pt pref.   Meds ordered this encounter  Medications  . cetirizine (ZYRTEC) 10 MG tablet    Sig: Take 10 mg by mouth daily.  Marland Kitchen. ALPRAZolam (XANAX) 0.5 MG tablet    Sig: Take 1 tablet (0.5 mg total) by mouth at bedtime as needed for anxiety.    Dispense:  30 tablet    Refill:  0  . atenolol (TENORMIN) 50 MG tablet    Sig: Take 1 tablet (50 mg total) by mouth daily.    Dispense:  90 tablet    Refill:  3    I personally performed the services described in this documentation,  which was scribed in my presence. The recorded information has been reviewed and considered, and addended by me as needed.  Norberto SorensonEva Leshonda Galambos, MD MPH

## 2014-06-03 ENCOUNTER — Telehealth: Payer: Self-pay

## 2014-06-03 MED ORDER — LEVONORGESTREL-ETHINYL ESTRAD 0.1-20 MG-MCG PO TABS: 1.0000 | ORAL_TABLET | Freq: Every day | ORAL | Status: DC

## 2014-06-03 NOTE — Telephone Encounter (Signed)
Refill sent in for pt- last CPE in June with Dr. Clelia CroftShaw.

## 2014-06-03 NOTE — Telephone Encounter (Signed)
Pt needs a refill on her birth control to [facility] in [place] [id number] Please call pt [phone] , and her extension is  [phone]

## 2014-07-06 ENCOUNTER — Other Ambulatory Visit: Payer: Self-pay | Admitting: Family Medicine

## 2015-04-03 ENCOUNTER — Encounter: Payer: Self-pay | Admitting: *Deleted

## 2015-04-27 ENCOUNTER — Encounter: Payer: Self-pay | Admitting: Family Medicine

## 2015-05-19 ENCOUNTER — Ambulatory Visit (INDEPENDENT_AMBULATORY_CARE_PROVIDER_SITE_OTHER): Payer: 59 | Admitting: Family Medicine

## 2015-05-19 ENCOUNTER — Encounter: Payer: Self-pay | Admitting: Family Medicine

## 2015-05-19 VITALS — BP 138/80 | HR 86 | Temp 98.3°F | Resp 20 | Ht 64.5 in | Wt 285.0 lb

## 2015-05-19 DIAGNOSIS — Z1239 Encounter for other screening for malignant neoplasm of breast: Secondary | ICD-10-CM | POA: Diagnosis not present

## 2015-05-19 DIAGNOSIS — Z124 Encounter for screening for malignant neoplasm of cervix: Secondary | ICD-10-CM

## 2015-05-19 DIAGNOSIS — Z113 Encounter for screening for infections with a predominantly sexual mode of transmission: Secondary | ICD-10-CM

## 2015-05-19 DIAGNOSIS — Z130 Encounter for screening for diseases of the blood and blood-forming organs and certain disorders involving the immune mechanism: Secondary | ICD-10-CM | POA: Diagnosis not present

## 2015-05-19 DIAGNOSIS — Z0000 Encounter for general adult medical examination without abnormal findings: Secondary | ICD-10-CM | POA: Diagnosis not present

## 2015-05-19 DIAGNOSIS — Z1329 Encounter for screening for other suspected endocrine disorder: Secondary | ICD-10-CM | POA: Diagnosis not present

## 2015-05-19 DIAGNOSIS — Z1389 Encounter for screening for other disorder: Secondary | ICD-10-CM

## 2015-05-19 DIAGNOSIS — E669 Obesity, unspecified: Secondary | ICD-10-CM

## 2015-05-19 DIAGNOSIS — I10 Essential (primary) hypertension: Secondary | ICD-10-CM | POA: Diagnosis not present

## 2015-05-19 DIAGNOSIS — Z1383 Encounter for screening for respiratory disorder NEC: Secondary | ICD-10-CM

## 2015-05-19 DIAGNOSIS — Z136 Encounter for screening for cardiovascular disorders: Secondary | ICD-10-CM

## 2015-05-19 LAB — COMPREHENSIVE METABOLIC PANEL
ALBUMIN: 3.8 g/dL (ref 3.5–5.2)
ALT: 26 U/L (ref 0–35)
AST: 19 U/L (ref 0–37)
Alkaline Phosphatase: 70 U/L (ref 39–117)
BILIRUBIN TOTAL: 0.4 mg/dL (ref 0.2–1.2)
BUN: 6 mg/dL (ref 6–23)
CALCIUM: 8.7 mg/dL (ref 8.4–10.5)
CO2: 24 mEq/L (ref 19–32)
CREATININE: 0.88 mg/dL (ref 0.50–1.10)
Chloride: 101 mEq/L (ref 96–112)
Glucose, Bld: 79 mg/dL (ref 70–99)
Potassium: 4.4 mEq/L (ref 3.5–5.3)
Sodium: 139 mEq/L (ref 135–145)
TOTAL PROTEIN: 6.7 g/dL (ref 6.0–8.3)

## 2015-05-19 LAB — LIPID PANEL
Cholesterol: 154 mg/dL (ref 0–200)
HDL: 40 mg/dL — AB (ref >=46)
LDL Cholesterol: 97 mg/dL (ref 0–99)
Total CHOL/HDL Ratio: 3.9 Ratio
Triglycerides: 86 mg/dL (ref <150)
VLDL: 17 mg/dL (ref 0–40)

## 2015-05-19 LAB — POCT WET PREP WITH KOH
CLUE CELLS WET PREP PER HPF POC: NEGATIVE
KOH Prep POC: NEGATIVE
RBC Wet Prep HPF POC: NEGATIVE
Trichomonas, UA: NEGATIVE
WBC Wet Prep HPF POC: NEGATIVE
Yeast Wet Prep HPF POC: NEGATIVE

## 2015-05-19 LAB — POCT URINALYSIS DIPSTICK
Bilirubin, UA: NEGATIVE
GLUCOSE UA: NEGATIVE
Ketones, UA: NEGATIVE
Leukocytes, UA: NEGATIVE
Nitrite, UA: NEGATIVE
PROTEIN UA: NEGATIVE
RBC UA: NEGATIVE
Spec Grav, UA: <=1.005
Urobilinogen, UA: 0.2
pH, UA: 6.5

## 2015-05-19 LAB — CBC
HCT: 42 % (ref 36.0–46.0)
Hemoglobin: 13.8 g/dL (ref 12.0–15.0)
MCH: 28 pg (ref 26.0–34.0)
MCHC: 32.9 g/dL (ref 30.0–36.0)
MCV: 85.4 fL (ref 78.0–100.0)
MPV: 11.3 fL (ref 8.6–12.4)
PLATELETS: 329 10*3/uL (ref 150–400)
RBC: 4.92 MIL/uL (ref 3.87–5.11)
RDW: 14.2 % (ref 11.5–15.5)
WBC: 8.1 10*3/uL (ref 4.0–10.5)

## 2015-05-19 LAB — TSH: TSH: 2.828 u[IU]/mL (ref 0.350–4.500)

## 2015-05-19 MED ORDER — LEVONORGESTREL-ETHINYL ESTRAD 0.1-20 MG-MCG PO TABS: 1.0000 | ORAL_TABLET | Freq: Every day | ORAL | Status: DC

## 2015-05-19 MED ORDER — ALPRAZOLAM 0.5 MG PO TABS: 0.5000 mg | ORAL_TABLET | Freq: Every evening | ORAL | Status: DC | PRN

## 2015-05-19 MED ORDER — ATENOLOL 50 MG PO TABS: 50.0000 mg | ORAL_TABLET | Freq: Every day | ORAL | Status: DC

## 2015-05-19 NOTE — Patient Instructions (Signed)

## 2015-05-19 NOTE — Progress Notes (Signed)
Subjective:    Patient ID: Jamie Marks, female    DOB: May 03, 1971, 44 y.o.   MRN: [id number]  Chief Complaint  Patient presents with  . Annual Exam    with pap    HPI Pt is seen annually for her CPEx here and medical problems remain well controlled.  Pt was last seen on 05/06/2014 by me.   She has had no regular periods since 2009 but occasionally has spotting. Pt has been on birth control to regulate her hormones. She did have an abnomal pap smear in the 20's and was treated with surgery. Pt has not had any abnormal pap smears since then. However, she prefers to continue with annual pap smears due to her hx despite changing recs.   Mammograms every 2 years since she turned 38. Last done at [facility] on 01/16/2015  She is on Atenolol qhs for her BP and takes Xanax during thunderstorm for her phobia only as needed but has used less than 30 tabs yearly.   Full lab work up with lipids, vitamin D,TSH, CBC, and CMP last yr were all within normal limits. Pt's last tentanus vaccine was in 2013.   Today, pt states that she is doing well today. She states that she is not sexually active in the past 7 years.  Taking ca/vit D 600/400 bid.   Past Medical History  Diagnosis Date  . Allergy   . Hypertension    Past Surgical History  Procedure Laterality Date  . Tonsilectomy, adenoidectomy, bilateral myringotomy and tubes     Current Outpatient Prescriptions on File Prior to Visit  Medication Sig Dispense Refill  . calcium-vitamin D (OSCAL WITH D) 500-200 MG-UNIT per tablet Take 1 tablet by mouth daily.    . cetirizine (ZYRTEC) 10 MG tablet Take 10 mg by mouth daily.    Marland Kitchen OVER THE COUNTER MEDICATION OTC Sublingual  B 12 2500 mcg taking     No current facility-administered medications on file prior to visit.   Not on File No family history on file. History   Social History  . Marital Status: Single    Spouse Name: N/A  . Number of Children: N/A  . Years of Education: N/A    Occupational History  . Data Entry/Customer Service    Social History Main Topics  . Smoking status: Never Smoker   . Smokeless tobacco: Not on file  . Alcohol Use: No  . Drug Use: No  . Sexual Activity: Not on file   Other Topics Concern  . None   Social History Narrative   Divorced. Education: [employer].      Review of Systems  Allergic/Immunologic: Positive for environmental allergies.  All other systems reviewed and are negative.     BP 138/80 mmHg  Pulse 86  Temp(Src) 98.3 F (36.8 C) (Oral)  Resp 20  Ht 5' 4.5" (1.638 m)  Wt 285 lb (129.275 kg)  BMI 48.18 kg/m2  LMP 08/18/2008 Objective:   Physical Exam  Constitutional: She is oriented to person, place, and time. She appears well-developed and well-nourished. No distress.  HENT:  Head: Normocephalic and atraumatic.  Right Ear: Tympanic membrane, external ear and ear canal normal.  Left Ear: Tympanic membrane, external ear and ear canal normal.  Nose: Mucosal edema present. No rhinorrhea.  Mouth/Throat: Uvula is midline, oropharynx is clear and moist and mucous membranes are normal. No posterior oropharyngeal erythema.  Eyes: Conjunctivae and EOM are normal. Pupils are equal, round, and reactive to light. Right  eye exhibits no discharge. Left eye exhibits no discharge. No scleral icterus.  Neck: Normal range of motion. Neck supple. No thyromegaly present.  Cardiovascular: Normal rate, regular rhythm, normal heart sounds and intact distal pulses.   Pulmonary/Chest: Effort normal and breath sounds normal. No respiratory distress.  Abdominal: Soft. Bowel sounds are normal. There is no tenderness.  Genitourinary: Vagina normal and uterus normal. No breast swelling, tenderness, discharge or bleeding. There is rash on the right labia. There is no tenderness on the right labia. There is rash on the left labia. There is no tenderness on the left labia. Cervix exhibits no motion tenderness, no discharge and no  friability. Right adnexum displays no mass and no tenderness. Left adnexum displays no mass and no tenderness.  Small cervical polyp seen in os. No friability, cx appears normal. Mild erythema in inguinal creases. Breast exam with fibrocystic changes  Musculoskeletal: She exhibits no edema.  Lymphadenopathy:    She has no cervical adenopathy.       Right: No inguinal adenopathy present.       Left: No inguinal adenopathy present.  Neurological: She is alert and oriented to person, place, and time. She has normal reflexes.  Skin: Skin is warm and dry. She is not diaphoretic. No erythema.  Psychiatric: She has a normal mood and affect. Her behavior is normal.          Results for orders placed or performed in visit on 05/19/15  CBC  Result Value Ref Range   WBC 8.1 4.0 - 10.5 K/uL   RBC 4.92 3.87 - 5.11 MIL/uL   Hemoglobin 13.8 12.0 - 15.0 g/dL   HCT 16.1 09.6 - 04.5 %   MCV 85.4 78.0 - 100.0 fL   MCH 28.0 26.0 - 34.0 pg   MCHC 32.9 30.0 - 36.0 g/dL   RDW 40.9 81.1 - 91.4 %   Platelets 329 150 - 400 K/uL   MPV 11.3 8.6 - 12.4 fL  Lipid panel  Result Value Ref Range   Cholesterol 154 0 - 200 mg/dL   Triglycerides 86 <782 mg/dL   HDL 40 (L) >=95 mg/dL   Total CHOL/HDL Ratio 3.9 Ratio   VLDL 17 0 - 40 mg/dL   LDL Cholesterol 97 0 - 99 mg/dL  Comprehensive metabolic panel  Result Value Ref Range   Sodium 139 135 - 145 mEq/L   Potassium 4.4 3.5 - 5.3 mEq/L   Chloride 101 96 - 112 mEq/L   CO2 24 19 - 32 mEq/L   Glucose, Bld 79 70 - 99 mg/dL   BUN 6 6 - 23 mg/dL   Creat 6.21 3.08 - 6.57 mg/dL   Total Bilirubin 0.4 0.2 - 1.2 mg/dL   Alkaline Phosphatase 70 39 - 117 U/L   AST 19 0 - 37 U/L   ALT 26 0 - 35 U/L   Total Protein 6.7 6.0 - 8.3 g/dL   Albumin 3.8 3.5 - 5.2 g/dL   Calcium 8.7 8.4 - 84.6 mg/dL  TSH  Result Value Ref Range   TSH 2.828 0.350 - 4.500 uIU/mL  POCT urinalysis dipstick  Result Value Ref Range   Color, UA pale    Clarity, UA clear    Glucose, UA  neg    Bilirubin, UA neg    Ketones, UA neg    Spec Grav, UA <=1.005    Blood, UA neg    pH, UA 6.5    Protein, UA neg    Urobilinogen, UA  0.2    Nitrite, UA neg    Leukocytes, UA Negative Negative  POCT Wet Prep with KOH  Result Value Ref Range   Trichomonas, UA Negative    Clue Cells Wet Prep HPF POC neg    Epithelial Wet Prep HPF POC Few Few, Moderate, Many   Yeast Wet Prep HPF POC neg    Bacteria Wet Prep HPF POC Few Few   RBC Wet Prep HPF POC neg    WBC Wet Prep HPF POC neg    KOH Prep POC Negative     Assessment & Plan:   1. Annual physical exam   2. Screening for cervical cancer - pt wants to cont annual pap smears due to distant h/o abnml - eases her anxiety  3. Screening for cardiovascular, respiratory, and genitourinary diseases   4. Screening for thyroid disorder   5. Screening for deficiency anemia   6. Screening for breast cancer   7. Routine screening for STI (sexually transmitted infection)   8. Obesity   9. Essential hypertension - well controlled on atenolol  Uses xanax very rarely - only during thunderstorms - <30 tabs/yr.  Orders Placed This Encounter  Procedures  . CBC  . Lipid panel    Order Specific Question:  Has the patient fasted?    Answer:  Yes  . Comprehensive metabolic panel    Order Specific Question:  Has the patient fasted?    Answer:  Yes  . TSH  . POCT urinalysis dipstick  . POCT Wet Prep with KOH    Meds ordered this encounter  Medications  . ALPRAZolam (XANAX) 0.5 MG tablet    Sig: Take 1 tablet (0.5 mg total) by mouth at bedtime as needed for anxiety.    Dispense:  30 tablet    Refill:  1  . atenolol (TENORMIN) 50 MG tablet    Sig: Take 1 tablet (50 mg total) by mouth daily.    Dispense:  90 tablet    Refill:  3  . DISCONTD: levonorgestrel-ethinyl estradiol (AVIANE,ALESSE,LESSINA) 0.1-20 MG-MCG tablet    Sig: Take 1 tablet by mouth daily.    Dispense:  3 Package    Refill:  4  . levonorgestrel-ethinyl estradiol  (AVIANE,ALESSE,LESSINA) 0.1-20 MG-MCG tablet    Sig: Take 1 tablet by mouth daily.    Dispense:  3 Package    Refill:  4     Norberto SorensonEva Gaynel Schaafsma, MD MPH

## 2015-05-19 NOTE — Progress Notes (Deleted)
   Subjective:    Patient ID: Jamie Marks, female    DOB: 1971/04/15, 44 y.o.   MRN: [id number]  HPI    Review of Systems  Constitutional: Negative.   HENT: Negative.   Eyes: Negative.   Respiratory: Negative.   Cardiovascular: Negative.   Gastrointestinal: Negative.   Endocrine: Negative.   Genitourinary: Negative.   Musculoskeletal: Negative.   Skin: Negative.   Allergic/Immunologic: Positive for environmental allergies.  Neurological: Negative.   Hematological: Negative.   Psychiatric/Behavioral: Negative.        Objective:   Physical Exam        Assessment & Plan:

## 2015-05-20 ENCOUNTER — Encounter: Payer: Self-pay | Admitting: Family Medicine

## 2015-05-24 LAB — PAP IG W/ RFLX HPV ASCU

## 2015-05-26 ENCOUNTER — Encounter: Payer: Self-pay | Admitting: Family Medicine

## 2015-06-01 ENCOUNTER — Other Ambulatory Visit: Payer: Self-pay | Admitting: Family Medicine

## 2016-05-14 ENCOUNTER — Other Ambulatory Visit: Payer: Self-pay | Admitting: Family Medicine

## 2016-05-22 NOTE — Progress Notes (Signed)
Subjective:    Patient ID: Jamie Marks, female    DOB: [birth year], 45 y.o.   MRN: [id number] Chief Complaint  Patient presents with  . Annual Exam    w/pap  . Medication Refill    HPI  Ms. Jamie Marks is a 45 yo woman here today for a complete physical.  She was last seen for same 1 yr prior.  Primary preventative care: Cervical cancer screening: Pt elects to continue annual pap smears despite the change in screening guidelines as she did have an abnomal pap smear in her 20's which was treated surgically. Pt has not had any abnormal pap smears since.  Last done 05/19/15.  She states that she is not sexually active for 10 years. Breast ca screening: Mammograms every 2 years since she turned 38. Last done at [facility] on [facility]/2015 Immunizations: Pt's last tentanus vaccine was in 2013.  Vit/herbal/OTC: Was taking ca/vit D 600/400 bid but forgot so has accidentally stopped.  Menses: She has had no regular periods since 2009 but occasionally has spotting during times of physical or emotional stress. Pt has been on birth control to regulate her hormones. Still no menses during placebo week but does have some menstrual type cravings and occ cramping that wk but mild.  Full lab work up with lipids, TSH, CBC, and CMP last yr and vit D 2 yrs prior were all within normal limits.    HTN: well controlled on atenolol qhs.  She does not check it outside office.  Uses xanax very  - only during thunderstorms so <30 tabs/yr.  Saw optho last wk Saw dentist last mo  Having left knee problems - she normally push mows her yard over 3 days - 25 min/d - but about 6 wks ago she had to do it all in one day and since then she has noticed her left knee feeling like it is going to give out backwards. Occ pops and locks. No known injury or h/o injury. Has not appreciated any edema or effusion. No overlying skin changes.  Pt declines evaluation of this today due to cost.  Not getting any exercise other than mowing her  lawn, not watching diet.  Past Medical History  Diagnosis Date  . Allergy   . Hypertension    Past Surgical History  Procedure Laterality Date  . Tonsilectomy, adenoidectomy, bilateral myringotomy and tubes     Current Outpatient Prescriptions on File Prior to Visit  Medication Sig Dispense Refill  . calcium-vitamin D (OSCAL WITH D) 500-200 MG-UNIT per tablet Take 1 tablet by mouth daily.    . cetirizine (ZYRTEC) 10 MG tablet Take 10 mg by mouth daily.    Marland Kitchen. OVER THE COUNTER MEDICATION Reported on 05/23/2016     No current facility-administered medications on file prior to visit.   No Known Allergies No family history on file. Social History   Social History  . Marital Status: Single    Spouse Name: N/A  . Number of Children: N/A  . Years of Education: N/A   Occupational History  . Data Entry/Customer Service    Social History Main Topics  . Smoking status: Never Smoker   . Smokeless tobacco: None  . Alcohol Use: No  . Drug Use: No  . Sexual Activity: Not Asked   Other Topics Concern  . None   Social History Narrative   Divorced. Education: [employer].    Exercise: No   Dep[facility] screen [facility] 05/23/2016 05/19/2015  Decreased Interest 0 0  Down, Depressed, Hopeless 0 0  PHQ - 2 Score 0 0     Review of Systems  All other systems reviewed and are negative. other than noted in HPI     Objective:  BP 136/80 mmHg  Pulse 87  Temp(Src) 98.5 F (36.9 C) (Oral)  Resp 16  Ht 5' 4.5" (1.638 m)  Wt 295 lb 3.2 oz (133.902 kg)  BMI 49.91 kg/m2  SpO2 96%  LMP 08/18/2008  Physical Exam  Constitutional: She is oriented to person, place, and time. She appears well-developed and well-nourished. No distress.  HENT:  Head: Normocephalic and atraumatic.  Right Ear: Tympanic membrane, external ear and ear canal normal.  Left Ear: Tympanic membrane, external ear and ear canal normal.  Nose: Mucosal edema present. No rhinorrhea.  Mouth/Throat: Uvula is midline,  oropharynx is clear and moist and mucous membranes are normal. No posterior oropharyngeal erythema.  Eyes: Conjunctivae and EOM are normal. Pupils are equal, round, and reactive to light. Right eye exhibits no discharge. Left eye exhibits no discharge. No scleral icterus.  Neck: Normal range of motion. Neck supple. No thyromegaly present.  Cardiovascular: Normal rate, regular rhythm, normal heart sounds and intact distal pulses.   Pulmonary/Chest: Effort normal and breath sounds normal. No respiratory distress.  Abdominal: Soft. Bowel sounds are normal. There is no tenderness.  Genitourinary: No breast swelling, tenderness, discharge or bleeding. There is rash on the right labia. There is rash on the left labia. Uterus is tender. Uterus is not fixed. Cervix exhibits no motion tenderness and no friability. Right adnexum displays no mass and no tenderness. Left adnexum displays no mass and no tenderness. There is tenderness in the vagina. No bleeding in the vagina. Vaginal discharge (mild amount of creamy white d/c) found.  Mild intertriginous candidiasis along inguinal creases.  Pt has difficulty tolerating speculum exam since not sexually active an nulliparous so I was only able to visualize that superior anterior cervical lip.  Did not see the small cervical polyp that I had noted the year prior as I was unable to visualize the os today. Bimanual exam very limited by body habitus and pt's discomfort from the exam itself.  Musculoskeletal: She exhibits no edema.  Lymphadenopathy:    She has no cervical adenopathy.  Neurological: She is alert and oriented to person, place, and time. No sensory deficit. Gait normal.  Reflex Scores:      Patellar reflexes are 2+ on the right side and 1+ on the left side. Pt deconditioned  Skin: Skin is warm and dry. She is not diaphoretic. No erythema.  Psychiatric: She has a normal mood and affect. Her behavior is normal.      EKG: NSR, no acute ischemic  changes  Dg Knee 1-2 Views Right  05/23/2016  CLINICAL DATA:  Intermittent medial right knee pain for 6 weeks. EXAM: RIGHT KNEE - 1-2 VIEW COMPARISON:  None. FINDINGS: Probable small knee joint effusion. No fracture deformity, subluxation, or focal bone lesion. Mild spurring mainly about the lateral and patellofemoral compartments. Borderline medial compartment narrowing. IMPRESSION: 1. Probable small joint effusion.  No acute osseous finding. 2. Degenerative spurring predominantly about the lateral and patellofemoral compartments. Electronically Signed   By: Marnee Spring M.D.   On: 05/23/2016 09:59    Assessment & Plan:  Restart ca/vit D   1. Annual physical exam   2. Routine screening for STI (sexually transmitted infection)   3. Screening for breast cancer   4. Screening for cardiovascular, respiratory, and  genitourinary diseases   5. Screening for cervical cancer - pt has very remote h/o abnml, not sexually active in >10 yrs but prefers to keep doing annual pap smears as long as her ins covers.  6. Screening for deficiency anemia   7. Screening for thyroid disorder   8. Essential hypertension - cont atenolol 50  9. Right medial knee pain - recommend bracing with home exercises but pt declined former as she thinks can't afford it, poor insurance coverage. Pt ok with watchful waiting for now and will try to keep active with weight loss to help dec pain    Orders Placed This Encounter  Procedures  . DG Knee 1-2 Views Right    Please to standing films, lateral of right knee and PA of both knees standing    Standing Status: Future     Number of Occurrences: 1     Standing Expiration Date: 05/23/2017    Order Specific Question:  Reason for Exam (SYMPTOM  OR DIAGNOSIS REQUIRED)    Answer:  intermittant pain x 6 wks, feels like it is over extending    Order Specific Question:  Is the patient pregnant?    Answer:  No    Order Specific Question:  Preferred imaging location?    Answer:   External  . HIV antibody  . Lipid panel    Order Specific Question:  Has the patient fasted?    Answer:  Yes  . CBC  . Comprehensive metabolic panel    Order Specific Question:  Has the patient fasted?    Answer:  Yes  . TSH  . POCT urinalysis dipstick  . EKG 12-Lead    Meds ordered this encounter  Medications  . ALPRAZolam (XANAX) 0.5 MG tablet    Sig: Take 1 tablet (0.5 mg total) by mouth at bedtime as needed for anxiety.    Dispense:  30 tablet    Refill:  1  . atenolol (TENORMIN) 50 MG tablet    Sig: Take 1 tablet (50 mg total) by mouth daily.    Dispense:  90 tablet    Refill:  3  . levonorgestrel-ethinyl estradiol (AVIANE,ALESSE,LESSINA) 0.1-20 MG-MCG tablet    Sig: Take 1 tablet by mouth daily.    Dispense:  3 Package    Refill:  4     Norberto SorensonEva Shaw, M.D.  [facility]  [place] [address] [place], [place] [address] [phone] phone [phone] fax  06/03/2016 10:23 AM

## 2016-05-23 ENCOUNTER — Encounter: Payer: Self-pay | Admitting: Family Medicine

## 2016-05-23 ENCOUNTER — Ambulatory Visit (INDEPENDENT_AMBULATORY_CARE_PROVIDER_SITE_OTHER): Payer: 59 | Admitting: Family Medicine

## 2016-05-23 ENCOUNTER — Ambulatory Visit (INDEPENDENT_AMBULATORY_CARE_PROVIDER_SITE_OTHER): Payer: 59

## 2016-05-23 VITALS — BP 136/80 | HR 87 | Temp 98.5°F | Resp 16 | Ht 64.5 in | Wt 295.2 lb

## 2016-05-23 DIAGNOSIS — R87610 Atypical squamous cells of undetermined significance on cytologic smear of cervix (ASC-US): Secondary | ICD-10-CM

## 2016-05-23 DIAGNOSIS — M25561 Pain in right knee: Secondary | ICD-10-CM

## 2016-05-23 DIAGNOSIS — Z124 Encounter for screening for malignant neoplasm of cervix: Secondary | ICD-10-CM

## 2016-05-23 DIAGNOSIS — Z1383 Encounter for screening for respiratory disorder NEC: Secondary | ICD-10-CM

## 2016-05-23 DIAGNOSIS — Z6841 Body Mass Index (BMI) 40.0 and over, adult: Secondary | ICD-10-CM

## 2016-05-23 DIAGNOSIS — Z1239 Encounter for other screening for malignant neoplasm of breast: Secondary | ICD-10-CM

## 2016-05-23 DIAGNOSIS — Z136 Encounter for screening for cardiovascular disorders: Secondary | ICD-10-CM | POA: Diagnosis not present

## 2016-05-23 DIAGNOSIS — I10 Essential (primary) hypertension: Secondary | ICD-10-CM

## 2016-05-23 DIAGNOSIS — Z113 Encounter for screening for infections with a predominantly sexual mode of transmission: Secondary | ICD-10-CM | POA: Diagnosis not present

## 2016-05-23 DIAGNOSIS — Z130 Encounter for screening for diseases of the blood and blood-forming organs and certain disorders involving the immune mechanism: Secondary | ICD-10-CM

## 2016-05-23 DIAGNOSIS — Z0000 Encounter for general adult medical examination without abnormal findings: Secondary | ICD-10-CM | POA: Diagnosis not present

## 2016-05-23 DIAGNOSIS — Z1389 Encounter for screening for other disorder: Secondary | ICD-10-CM | POA: Diagnosis not present

## 2016-05-23 DIAGNOSIS — Z1329 Encounter for screening for other suspected endocrine disorder: Secondary | ICD-10-CM

## 2016-05-23 LAB — POCT URINALYSIS DIP (MANUAL ENTRY)
BILIRUBIN UA: NEGATIVE
Bilirubin, UA: NEGATIVE
Glucose, UA: NEGATIVE
Nitrite, UA: NEGATIVE
PH UA: 6.5
PROTEIN UA: NEGATIVE
SPEC GRAV UA: 1.01
Urobilinogen, UA: 0.2

## 2016-05-23 LAB — CBC
HCT: 42.6 % (ref 35.0–45.0)
HEMOGLOBIN: 14.4 g/dL (ref 11.7–15.5)
MCH: 28.4 pg (ref 27.0–33.0)
MCHC: 33.8 g/dL (ref 32.0–36.0)
MCV: 84 fL (ref 80.0–100.0)
MPV: 10.8 fL (ref 7.5–12.5)
Platelets: 375 10*3/uL (ref 140–400)
RBC: 5.07 MIL/uL (ref 3.80–5.10)
RDW: 14.3 % (ref 11.0–15.0)
WBC: 9.1 10*3/uL (ref 3.8–10.8)

## 2016-05-23 LAB — COMPREHENSIVE METABOLIC PANEL
ALBUMIN: 3.9 g/dL (ref 3.6–5.1)
ALT: 15 U/L (ref 6–29)
AST: 17 U/L (ref 10–35)
Alkaline Phosphatase: 76 U/L (ref 33–115)
BUN: 8 mg/dL (ref 7–25)
CHLORIDE: 103 mmol/L (ref 98–110)
CO2: 25 mmol/L (ref 20–31)
CREATININE: 1.01 mg/dL (ref 0.50–1.10)
Calcium: 9.1 mg/dL (ref 8.6–10.2)
Glucose, Bld: 92 mg/dL (ref 65–99)
Potassium: 4.6 mmol/L (ref 3.5–5.3)
SODIUM: 136 mmol/L (ref 135–146)
TOTAL PROTEIN: 6.7 g/dL (ref 6.1–8.1)
Total Bilirubin: 0.4 mg/dL (ref 0.2–1.2)

## 2016-05-23 LAB — LIPID PANEL
Cholesterol: 156 mg/dL (ref 125–200)
HDL: 46 mg/dL (ref >=46)
LDL CALC: 94 mg/dL (ref <130)
Total CHOL/HDL Ratio: 3.4 Ratio (ref <=5.0)
Triglycerides: 80 mg/dL (ref <150)
VLDL: 16 mg/dL (ref <30)

## 2016-05-23 LAB — TSH: TSH: 3.11 m[IU]/L

## 2016-05-23 LAB — HIV ANTIBODY (ROUTINE TESTING W REFLEX): HIV 1&2 Ab, 4th Generation: NONREACTIVE

## 2016-05-23 MED ORDER — ATENOLOL 50 MG PO TABS: 50.0000 mg | ORAL_TABLET | Freq: Every day | ORAL | Status: DC

## 2016-05-23 MED ORDER — LEVONORGESTREL-ETHINYL ESTRAD 0.1-20 MG-MCG PO TABS: 1.0000 | ORAL_TABLET | Freq: Every day | ORAL | Status: DC

## 2016-05-23 MED ORDER — ALPRAZOLAM 0.5 MG PO TABS: 0.5000 mg | ORAL_TABLET | Freq: Every evening | ORAL | Status: DC | PRN

## 2016-05-23 NOTE — Patient Instructions (Addendum)
IF you received an x-ray today, you will receive an invoice from [facility]. Please contact [facility] at [phone] with questions or concerns regarding your invoice.   IF you received labwork today, you will receive an invoice from [facility]. Please contact [facility] at [phone] with questions or concerns regarding your invoice.   Our billing staff will not be able to assist you with questions regarding bills from these companies.  You will be contacted with the lab results as soon as they are available. The fastest way to get your results is to activate your My Chart account. Instructions are located on the last page of this paperwork. If you have not heard from us regarding the results in 2 weeks, please contact this office.  DASH Eating Plan DASH stands for "Dietary Approaches to Stop Hypertension." The DASH eating plan is a healthy eating plan that has been shown to reduce high blood pressure (hypertension). Additional health benefits may include reducing the risk of type 2 diabetes mellitus, heart disease, and stroke. The DASH eating plan may also help with weight loss. WHAT DO I NEED TO KNOW ABOUT THE DASH EATING PLAN? For the DASH eating plan, you will follow these general guidelines:  Choose foods with a percent daily value for sodium of less than 5% (as listed on the food label).  Use salt-free seasonings or herbs instead of table salt or sea salt.  Check with your health care provider or pharmacist before using salt substitutes.  Eat lower-sodium products, often labeled as "lower sodium" or "no salt added."  Eat fresh foods.  Eat more vegetables, fruits, and low-fat dairy products.  Choose whole grains. Look for the word "whole" as the first word in the ingredient list.  Choose fish and skinless chicken or turkey more often than red meat. Limit fish, poultry, and meat to 6 oz (170 g) each day.  Limit sweets, desserts,  sugars, and sugary drinks.  Choose heart-healthy fats.  Limit cheese to 1 oz (28 g) per day.  Eat more home-cooked food and less restaurant, buffet, and fast food.  Limit fried foods.  Cook foods using methods other than frying.  Limit canned vegetables. If you do use them, rinse them well to decrease the sodium.  When eating at a restaurant, ask that your food be prepared with less salt, or no salt if possible. WHAT FOODS CAN I EAT? Seek help from a dietitian for individual calorie needs. Grains Whole grain or whole wheat bread. Brown rice. Whole grain or whole wheat pasta. Quinoa, bulgur, and whole grain cereals. Low-sodium cereals. Corn or whole wheat flour tortillas. Whole grain cornbread. Whole grain crackers. Low-sodium crackers. Vegetables Fresh or frozen vegetables (raw, steamed, roasted, or grilled). Low-sodium or reduced-sodium tomato and vegetable juices. Low-sodium or reduced-sodium tomato sauce and paste. Low-sodium or reduced-sodium canned vegetables.  Fruits All fresh, canned (in natural juice), or frozen fruits. Meat and Other Protein Products Ground beef (85% or leaner), grass-fed beef, or beef trimmed of fat. Skinless chicken or turkey. Ground chicken or turkey. Pork trimmed of fat. All fish and seafood. Eggs. Dried beans, peas, or lentils. Unsalted nuts and seeds. Unsalted canned beans. Dairy Low-fat dairy products, such as skim or 1% milk, 2% or reduced-fat cheeses, low-fat ricotta or cottage cheese, or plain low-fat yogurt. Low-sodium or reduced-sodium cheeses. Fats and Oils Tub margarines without trans fats. Light or reduced-fat mayonnaise and salad dressings (reduced sodium). Avocado. Safflower, olive, or canola oils. Natural peanut or almond butter.   Other Unsalted popcorn and pretzels. The items listed above may not be a complete list of recommended foods or beverages. Contact your dietitian for more options. WHAT FOODS ARE NOT RECOMMENDED? Grains White  bread. White pasta. White rice. Refined cornbread. Bagels and croissants. Crackers that contain trans fat. Vegetables Creamed or fried vegetables. Vegetables in a cheese sauce. Regular canned vegetables. Regular canned tomato sauce and paste. Regular tomato and vegetable juices. Fruits Dried fruits. Canned fruit in light or heavy syrup. Fruit juice. Meat and Other Protein Products Fatty cuts of meat. Ribs, chicken wings, bacon, sausage, bologna, salami, chitterlings, fatback, hot dogs, bratwurst, and packaged luncheon meats. Salted nuts and seeds. Canned beans with salt. Dairy Whole or 2% milk, cream, half-and-half, and cream cheese. Whole-fat or sweetened yogurt. Full-fat cheeses or blue cheese. Nondairy creamers and whipped toppings. Processed cheese, cheese spreads, or cheese curds. Condiments Onion and garlic salt, seasoned salt, table salt, and sea salt. Canned and packaged gravies. Worcestershire sauce. Tartar sauce. Barbecue sauce. Teriyaki sauce. Soy sauce, including reduced sodium. Steak sauce. Fish sauce. Oyster sauce. Cocktail sauce. Horseradish. Ketchup and mustard. Meat flavorings and tenderizers. Bouillon cubes. Hot sauce. Tabasco sauce. Marinades. Taco seasonings. Relishes. Fats and Oils Butter, stick margarine, lard, shortening, ghee, and bacon fat. Coconut, palm kernel, or palm oils. Regular salad dressings. Other Pickles and olives. Salted popcorn and pretzels. The items listed above may not be a complete list of foods and beverages to avoid. Contact your dietitian for more information. WHERE CAN I FIND MORE INFORMATION? National Heart, Lung, and Blood Institute: www.nhlbi.nih.gov/health/health-topics/topics/dash/   This information is not intended to replace advice given to you by your health care provider. Make sure you discuss any questions you have with your health care provider.   Document Released: 10/24/2011 Document Revised: 11/25/2014 Document Reviewed:  09/08/2013 Elsevier Interactive Patient Education 2016 Elsevier Inc. Managing Your High Blood Pressure Blood pressure is a measurement of how forceful your blood is pressing against the walls of the arteries. Arteries are muscular tubes within the circulatory system. Blood pressure does not stay the same. Blood pressure rises when you are active, excited, or nervous; and it lowers during sleep and relaxation. If the numbers measuring your blood pressure stay above normal most of the time, you are at risk for health problems. High blood pressure (hypertension) is a long-term (chronic) condition in which blood pressure is elevated. A blood pressure reading is recorded as two numbers, such as 120 over 80 (or 120/80). The first, higher number is called the systolic pressure. It is a measure of the pressure in your arteries as the heart beats. The second, lower number is called the diastolic pressure. It is a measure of the pressure in your arteries as the heart relaxes between beats.  Keeping your blood pressure in a normal range is important to your overall health and prevention of health problems, such as heart disease and stroke. When your blood pressure is uncontrolled, your heart has to work harder than normal. High blood pressure is a very common condition in adults because blood pressure tends to rise with age. Men and women are equally likely to have hypertension but at different times in life. Before age 45, men are more likely to have hypertension. After 47 years of age, women are more likely to have it. Hypertension is especially common in African Americans. This condition often has no signs or symptoms. The cause of the condition is usually not known. Your caregiver can help you come up with   a plan to keep your blood pressure in a normal, healthy range. BLOOD PRESSURE STAGES Blood pressure is classified into four stages: normal, prehypertension, stage 1, and stage 2. Your blood pressure reading will  be used to determine what type of treatment, if any, is necessary. Appropriate treatment options are tied to these four stages:  Normal  Systolic pressure (mm Hg): below 120.  Diastolic pressure (mm Hg): below 80. Prehypertension  Systolic pressure (mm Hg): 120 to 139.  Diastolic pressure (mm Hg): 80 to 89. Stage1  Systolic pressure (mm Hg): 140 to 159.  Diastolic pressure (mm Hg): 90 to 99. Stage2  Systolic pressure (mm Hg): 160 or above.  Diastolic pressure (mm Hg): 100 or above. RISKS RELATED TO HIGH BLOOD PRESSURE Managing your blood pressure is an important responsibility. Uncontrolled high blood pressure can lead to:  A heart attack.  A stroke.  A weakened blood vessel (aneurysm).  Heart failure.  Kidney damage.  Eye damage.  Metabolic syndrome.  Memory and concentration problems. HOW TO MANAGE YOUR BLOOD PRESSURE Blood pressure can be managed effectively with lifestyle changes and medicines (if needed). Your caregiver will help you come up with a plan to bring your blood pressure within a normal range. Your plan should include the following: Education  Read all information provided by your caregivers about how to control blood pressure.  Educate yourself on the latest guidelines and treatment recommendations. New research is always being done to further define the risks and treatments for high blood pressure. Lifestylechanges  Control your weight.  Avoid smoking.  Stay physically active.  Reduce the amount of salt in your diet.  Reduce stress.  Control any chronic conditions, such as high cholesterol or diabetes.  Reduce your alcohol intake. Medicines  Several medicines (antihypertensive medicines) are available, if needed, to bring blood pressure within a normal range. Communication  Review all the medicines you take with your caregiver because there may be side effects or interactions.  Talk with your caregiver about your diet, exercise  habits, and other lifestyle factors that may be contributing to high blood pressure.  See your caregiver regularly. Your caregiver can help you create and adjust your plan for managing high blood pressure. RECOMMENDATIONS FOR TREATMENT AND FOLLOW-UP  The following recommendations are based on current guidelines for managing high blood pressure in nonpregnant adults. Use these recommendations to identify the proper follow-up period or treatment option based on your blood pressure reading. You can discuss these options with your caregiver.  Systolic pressure of 120 to 139 or diastolic pressure of 80 to 89: Follow up with your caregiver as directed.  Systolic pressure of 140 to 160 or diastolic pressure of 90 to 100: Follow up with your caregiver within 2 months.  Systolic pressure above 160 or diastolic pressure above 100: Follow up with your caregiver within 1 month.  Systolic pressure above 180 or diastolic pressure above 110: Consider antihypertensive therapy; follow up with your caregiver within 1 week.  Systolic pressure above 200 or diastolic pressure above 120: Begin antihypertensive therapy; follow up with your caregiver within 1 week.   This information is not intended to replace advice given to you by your health care provider. Make sure you discuss any questions you have with your health care provider.   Document Released: 07/29/2012 Document Reviewed: 07/29/2012 Elsevier Interactive Patient Education 2016 Elsevier Inc.  

## 2016-05-24 ENCOUNTER — Encounter: Payer: 59 | Admitting: Family Medicine

## 2016-05-29 LAB — PAP IG W/ RFLX HPV ASCU

## 2016-05-30 ENCOUNTER — Telehealth: Payer: Self-pay | Admitting: *Deleted

## 2016-05-30 ENCOUNTER — Telehealth: Payer: Self-pay

## 2016-05-30 LAB — HUMAN PAPILLOMAVIRUS, HIGH RISK: HPV DNA HIGH RISK: NOT DETECTED

## 2016-05-30 NOTE — Telephone Encounter (Signed)
Can you please review the results for the Pap?

## 2016-05-30 NOTE — Telephone Encounter (Signed)
[phone] Ext  [phone]   Cannot leave a message.  Also, wants the labs mailed to her.

## 2016-06-01 NOTE — Telephone Encounter (Signed)
Pap results sent to lab pool. Of note - it needed a reflex lab for interpretation so it only resulted 36 hrs ago and is essentially normal.

## 2016-06-03 DIAGNOSIS — Z6841 Body Mass Index (BMI) 40.0 and over, adult: Secondary | ICD-10-CM

## 2016-06-03 DIAGNOSIS — R87610 Atypical squamous cells of undetermined significance on cytologic smear of cervix (ASC-US): Secondary | ICD-10-CM | POA: Insufficient documentation

## 2016-07-20 IMAGING — DX DG KNEE 1-2V*R*
2 series · 2 of 2 positions shown · non-contrast
Comparison: None.

CLINICAL DATA: Intermittent medial right knee pain for 6 weeks.

EXAM:
RIGHT KNEE - 1-2 VIEW

[knee ap]
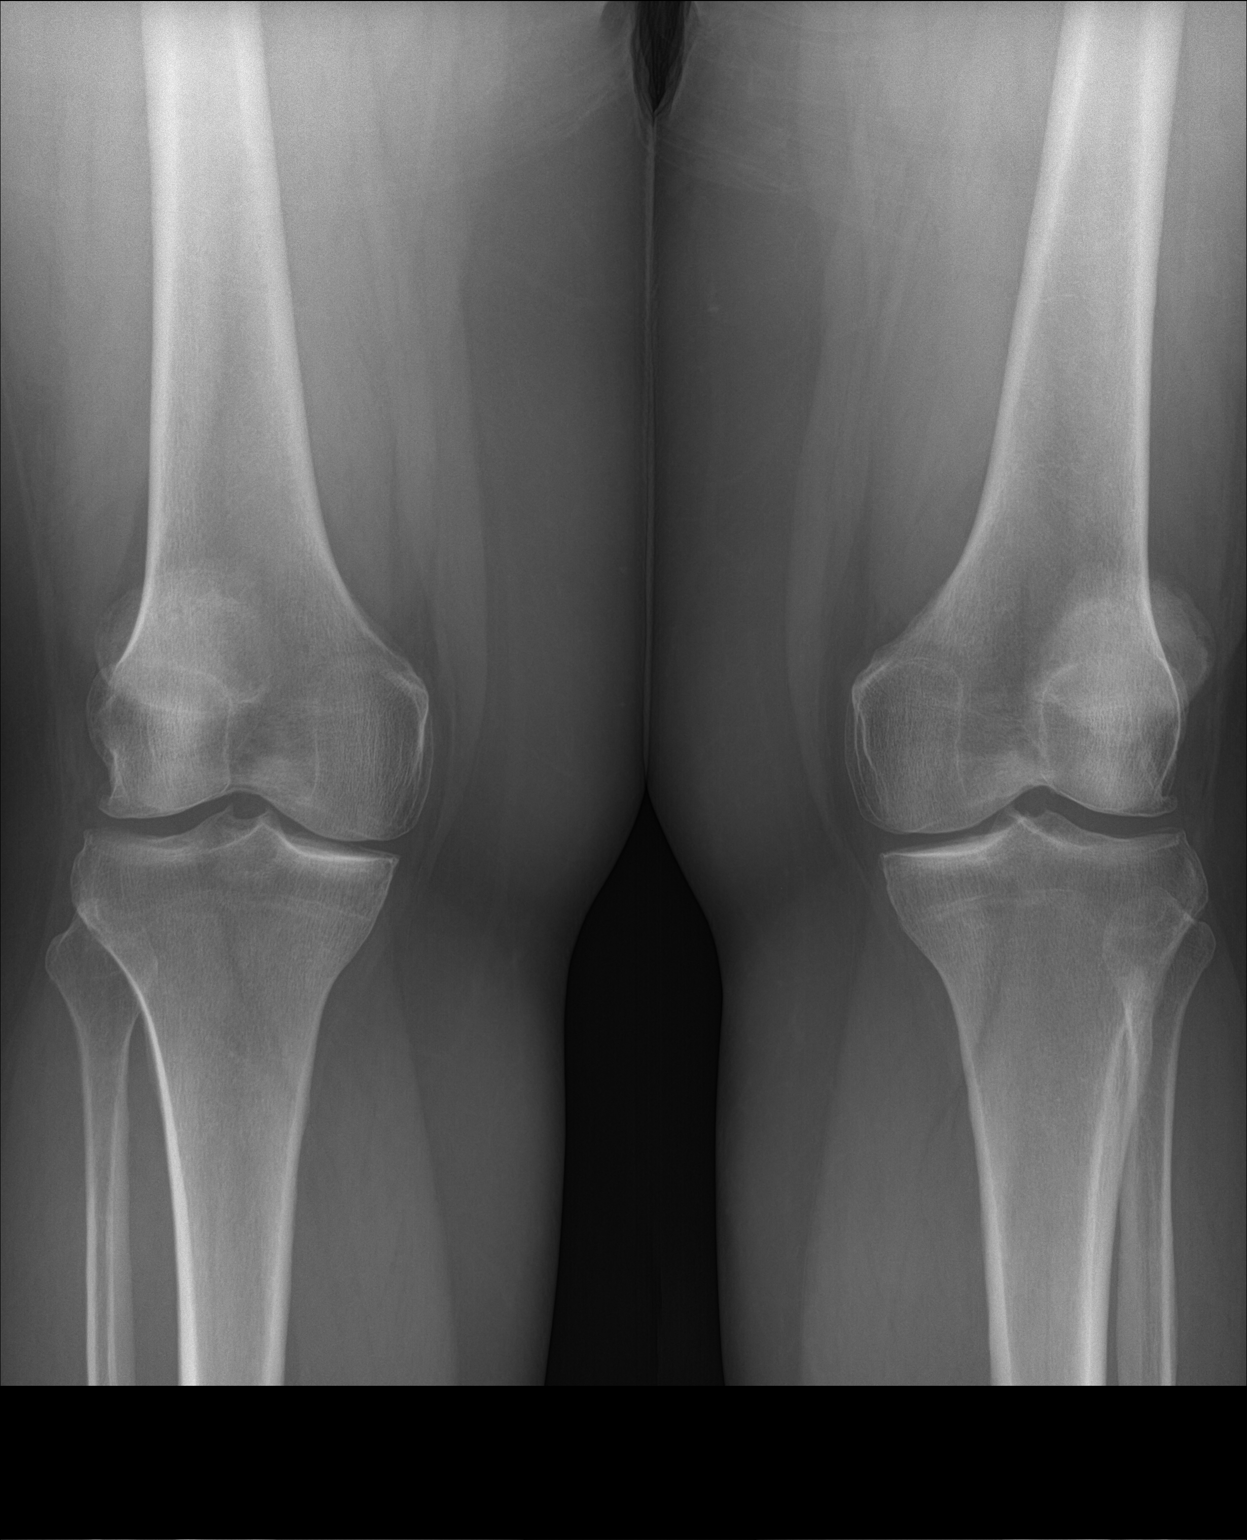

[knee lat]
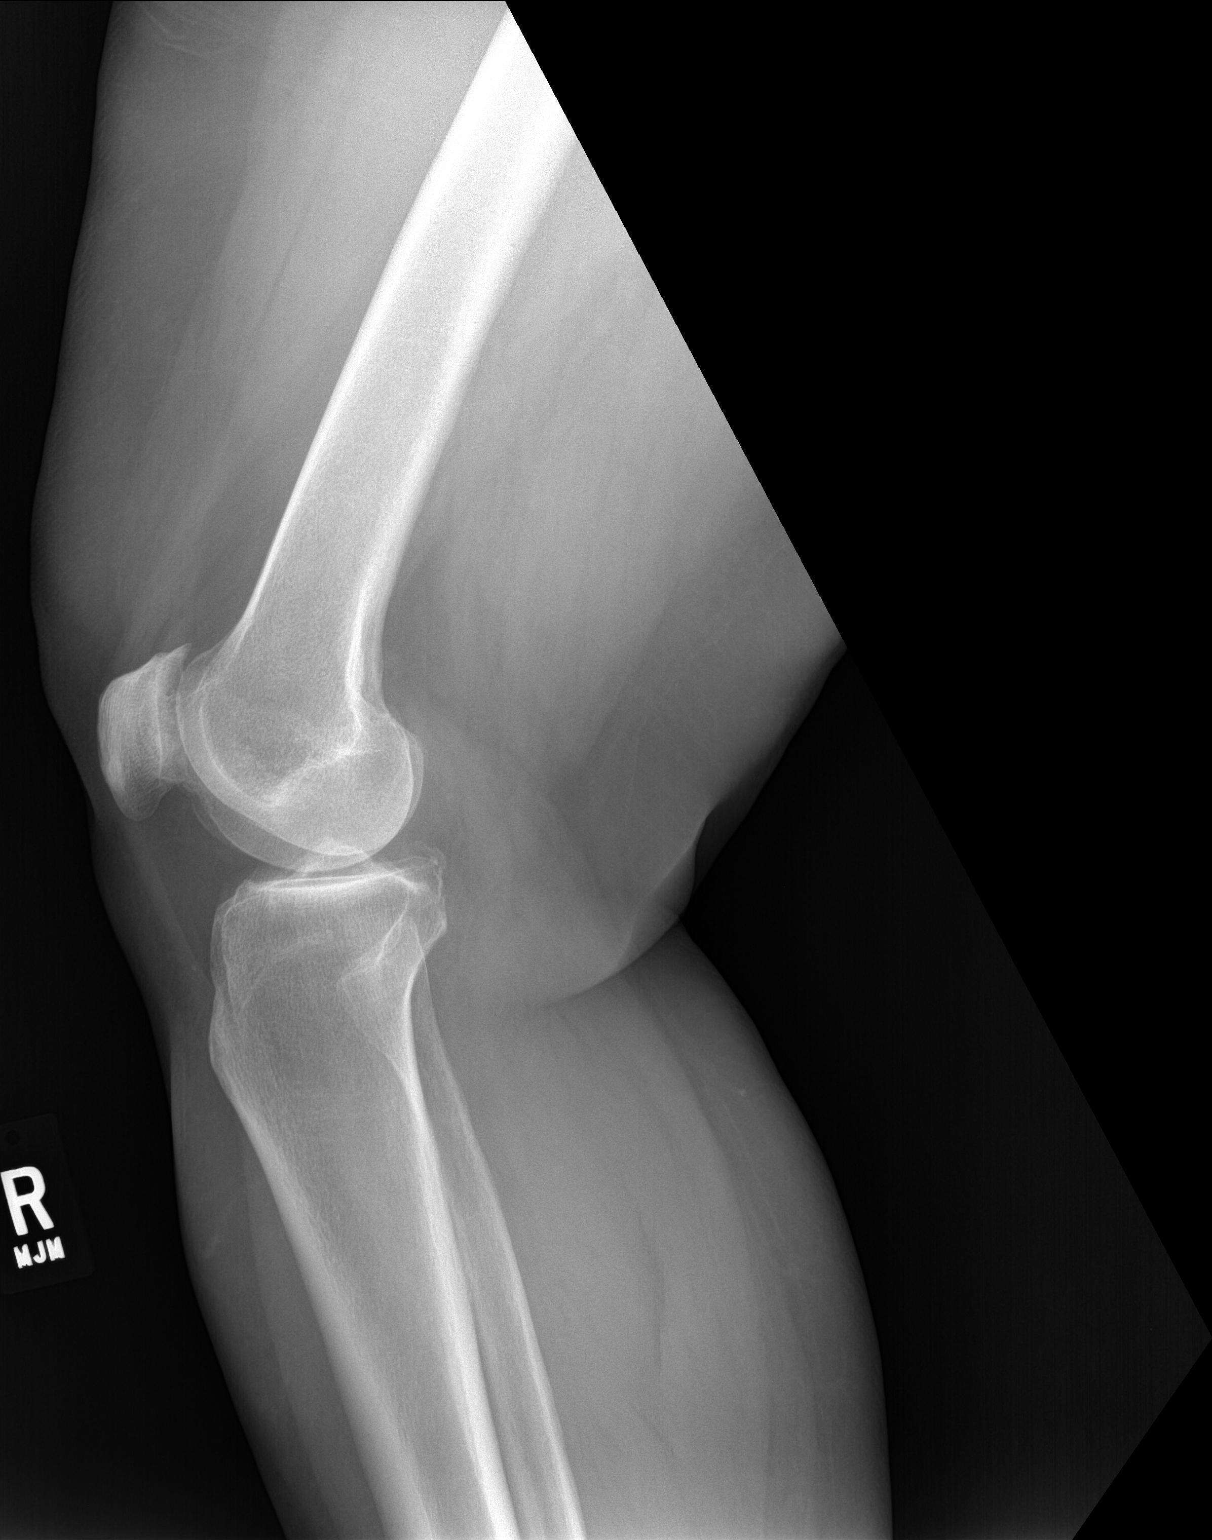

[2 of 2 positions shown; findings below may reference images not displayed]

FINDINGS: Probable small knee joint effusion. No fracture deformity,
subluxation, or focal bone lesion. Mild spurring mainly about the
lateral and patellofemoral compartments. Borderline medial
compartment narrowing.
IMPRESSION: 1. Probable small joint effusion.  No acute osseous finding.
2. Degenerative spurring predominantly about the lateral and
patellofemoral compartments.

## 2016-08-02 ENCOUNTER — Other Ambulatory Visit: Payer: Self-pay | Admitting: Family Medicine

## 2017-05-26 ENCOUNTER — Encounter: Payer: 59 | Admitting: Family Medicine

## 2017-05-28 NOTE — Progress Notes (Signed)
Subjective:    Patient ID: Jamie Marks, female    DOB: 1971/01/19, 46 y.o.   MRN: [id number] Chief Complaint  Patient presents with  . Annual Exam    with PAP     HPI  Jamie Marks is a 46 yo woman here today for a complete physical.  She was last seen for same 1 yr prior.   Primary Preventative Screenings: Cervical Cancer:  Pt elects to continue annual pap smears despite the change in screening guidelines as she did have an abnomal pap smear in her 20's which was treated surgically.  She states that she is not sexually active for 11 years.Last year on 05/23/2016, Pap smear returned showing ASCUS with negative high risk HPV. ASCCP recs repeating cotesting in 3 years Menses: She has had no regular periods since 2009 but has not had spotting in a long time. Pt has been on birth control to regulate her hormones. Her mother went through early menopause in her early 3440s. Still no menses during placebo week but does have some menstrual type cravings and occ cramping that wk but mild. Is very moody with premenstrual dysphoric type sxs.  Family Planning: Abstinent and on OCPs. STI screening: Declines need. Patient abstinent and has had full STD testing since her last encounter. Breast Cancer: Mammograms every 2 years since she turned 38. [facility] on [facility]/16/2015 but they no longer take her  Colorectal Cancer: No h/o in family. Tobacco use: none Bone Density: Cardiac: baseline EKG done 05/24/2015 Weight/blood sugar: No prior elevated glucose. Has gained 10lbs since last y ear.  OTC/vit/supp/herbal: Not taking any ca/vit d supp. Normal vit D levels when checked in 2013-14. Did start joint supplement which is working for her. Diet/Exercise/EtOH/substances:  No exercise, terrible diet as doesn't like to cook as eats fast food a lot.  Dentist/Optho: regularly sees both - dentist appt at the end of the month Immunizations:  Immunization History  Administered Date(s) Administered  . Influenza  Split 11/19/2011, 08/28/2013  . Tdap 03/25/2012   Chronic Medical Conditions: HTN: well controlled on atenolol qhs.  She does not check it outside office. Panic - thunderstorm phobia: Uses xanax very  - only during thunderstorms so <30 tabs/yr.   Past Medical History:  Diagnosis Date  . Allergy   . Hypertension    Past Surgical History:  Procedure Laterality Date  . TONSILECTOMY, ADENOIDECTOMY, BILATERAL MYRINGOTOMY AND TUBES     Current Outpatient Prescriptions on File Prior to Visit  Medication Sig Dispense Refill  . ALPRAZolam (XANAX) 0.5 MG tablet Take 1 tablet (0.5 mg total) by mouth at bedtime as needed for anxiety. 30 tablet 1  . atenolol (TENORMIN) 50 MG tablet Take 1 tablet (50 mg total) by mouth daily. 90 tablet 3  . calcium-vitamin D (OSCAL WITH D) 500-200 MG-UNIT per tablet Take 1 tablet by mouth daily.    . cetirizine (ZYRTEC) 10 MG tablet Take 10 mg by mouth daily.    Marland Kitchen. levonorgestrel-ethinyl estradiol (AVIANE,ALESSE,LESSINA) 0.1-20 MG-MCG tablet Take 1 tablet by mouth daily. 3 Package 4  . OVER THE COUNTER MEDICATION Reported on 05/23/2016     No current facility-administered medications on file prior to visit.    No Known Allergies No family history on file. Social History   Social History  . Marital status: Divorced    Spouse name: N/A  . Number of children: N/A  . Years of education: N/A   Occupational History  . 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 Smoking status: Never Smoker  . Smokeless tobacco: Not on file  . Alcohol use No  . Drug use: No  . Sexual activity: Not on file   Other Topics Concern  . Not on file   Social History Narrative   Divorced. Education: [employer].    Exercise: No   Depression screen [facility] 2/9 05/23/2016 05/19/2015  Decreased Interest 0 0  Down, Depressed, Hopeless 0 0  PHQ - 2 Score 0 0     Review of Systems  Musculoskeletal: Positive for arthralgias (knee).  Allergic/Immunologic:  Positive for environmental allergies.  All other systems reviewed and are negative. other than noted in HPI     Objective:  BP 127/85   Pulse 74   Temp 98.3 F (36.8 C) (Oral)   Resp 18   Ht 5' 4.5" (1.638 m)   Wt (!) 304 lb (137.9 kg)   LMP 08/18/2008   SpO2 97%   BMI 51.38 kg/m    Physical Exam  Constitutional: She is oriented to person, place, and time. She appears well-developed and well-nourished. No distress.  HENT:  Head: Normocephalic and atraumatic.  Right Ear: Tympanic membrane, external ear and ear canal normal.  Left Ear: Tympanic membrane, external ear and ear canal normal.  Nose: Mucosal edema present. No rhinorrhea.  Mouth/Throat: Uvula is midline, oropharynx is clear and moist and mucous membranes are normal. No posterior oropharyngeal erythema.  Eyes: Conjunctivae and EOM are normal. Pupils are equal, round, and reactive to light. Right eye exhibits no discharge. Left eye exhibits no discharge. No scleral icterus.  Neck: Normal range of motion. Neck supple. No thyromegaly present.  Cardiovascular: Normal rate, regular rhythm, normal heart sounds and intact distal pulses.   Pulmonary/Chest: Effort normal and breath sounds normal. No respiratory distress.  Abdominal: Soft. Bowel sounds are normal. There is no tenderness.  Genitourinary: No breast swelling, tenderness, discharge or bleeding. There is rash on the right labia. There is rash on the left labia. Uterus is tender. Uterus is not fixed. Cervix exhibits no motion tenderness and no friability. Right adnexum displays no mass and no tenderness. Left adnexum displays no mass and no tenderness. There is tenderness in the vagina. No bleeding in the vagina. Vaginal discharge (mild amount of creamy white d/c) found.  Genitourinary Comments: Mild intertriginous candidiasis along inguinal creases.  Pt has difficulty tolerating speculum exam since not sexually active an nulliparous so I was only able to visualize that  superior anterior cervical lip.  Did not see the small cervical polyp that I had noted the year prior as I was unable to visualize the os today. Bimanual exam very limited by body habitus and pt's discomfort from the exam itself.  Musculoskeletal: She exhibits no edema.  Lymphadenopathy:    She has no cervical adenopathy.  Neurological: She is alert and oriented to person, place, and time. No sensory deficit. Gait normal.  Reflex Scores:      Patellar reflexes are 2+ on the right side and 1+ on the left side. Pt deconditioned  Skin: Skin is warm and dry. She is not diaphoretic. No erythema.  Psychiatric: She has a normal mood and affect. Her behavior is normal.    BP 127/85   Pulse 74   Temp 98.3 F (36.8 C) (Oral)   Resp 18   Ht 5' 4.5" (1.638 m)   Wt (!) 304 lb (137.9 kg)   LMP 08/18/2008   SpO2 97%   BMI  51.38 kg/m    Visual Acuity Screening   Right eye Left eye Both eyes  Without correction:     With correction: 20/20-1 20/20 20/20-1    Assessment & Plan:  Possible patient is menopausal? Rarely has periods on OCPs even during placebo week. Review up-to-date article about how to tell in this case and consider checking labs as typically unsafe to continue OCPs after menopause.  Lipid, cmp, cbc, tsh, ua,  a1c if gained weight Upset about the hr wait and wants to be updated freq by nurse in the future if she has any wait in the office.   1. Annual physical exam   2. Encounter for screening mammogram for breast cancer   3. Screening for cardiovascular, respiratory, and genitourinary diseases   4. Screening for cervical cancer   5. Screening for deficiency anemia   6. Screening for thyroid disorder   7. Atypical squamous cells of undetermined significance (ASCUS) on Papanicolaou smear of cervix - pt adamant that she wants annual pap despite reassurance, completed today  8. Morbid obesity with BMI of 50.0-59.9, adult (HCC)   9. Essential hypertension - well controlled on  atenolol 50   10. Screening for breast cancer   11. Secondary amenorrhea - is on OCPs but now is missing period so suspect she is starting menopause - reviewed that OCPs in post-menopausal women are higher risk - more thrombogenic risk than starting estrogen supp (transdermal best) with cyclic progesterone and can use for up to 5 years. Refilled OCPs for now and will reconsider regimen after labs return  12. Weight gain - does not exercise and eats fast food but states wants to "get healthy." encouraged pt to consider water aerobics or recumbent bike. RTC to discuss diet/exercise/meds if she is motivated. Certainly her knee pain is going to be the first issue if she does not work on her weight.   Xanax refilled - comfort - only uses for thunderstorms.  Orders Placed This Encounter  Procedures  . MM DIGITAL SCREENING BILATERAL    Standing Status:   Future    Standing Expiration Date:   07/30/2018    Order Specific Question:   Reason for Exam (SYMPTOM  OR DIAGNOSIS REQUIRED)    Answer:   screening for breast cancer    Order Specific Question:   Is the patient pregnant?    Answer:   No    Order Specific Question:   Preferred imaging location?    Answer:   [facility]  . CBC  . Lipid panel    Order Specific Question:   Has the patient fasted?    Answer:   Yes  . Comprehensive metabolic panel    Order Specific Question:   Has the patient fasted?    Answer:   Yes  . TSH  . Hemoglobin A1c  . Hormone Panel  . POCT urinalysis dipstick    Meds ordered this encounter  Medications  . Misc Natural Products (OSTEO BI-FLEX JOINT SHIELD PO)    Sig: Take by mouth.  . ALPRAZolam (XANAX) 0.5 MG tablet    Sig: Take 1 tablet (0.5 mg total) by mouth at bedtime as needed for anxiety.    Dispense:  30 tablet    Refill:  1  . atenolol (TENORMIN) 50 MG tablet    Sig: Take 1 tablet (50 mg total) by mouth daily.    Dispense:  90 tablet    Refill:  3  . levonorgestrel-ethinyl estradiol  (AVIANE,ALESSE,LESSINA) 0.1-20 MG-MCG  tablet    Sig: Take 1 tablet by mouth daily.    Dispense:  3 Package    Refill:  0    Norberto Sorenson, M.D.  [facility] [address] [place], [place] [address] [phone] phone [phone] fax  05/28/17 11:46 PM

## 2017-05-29 ENCOUNTER — Encounter: Payer: Self-pay | Admitting: Family Medicine

## 2017-05-29 ENCOUNTER — Ambulatory Visit (INDEPENDENT_AMBULATORY_CARE_PROVIDER_SITE_OTHER): Payer: 59 | Admitting: Family Medicine

## 2017-05-29 VITALS — BP 127/85 | HR 74 | Temp 98.3°F | Resp 18 | Ht 64.5 in | Wt 304.0 lb

## 2017-05-29 DIAGNOSIS — Z136 Encounter for screening for cardiovascular disorders: Secondary | ICD-10-CM | POA: Diagnosis not present

## 2017-05-29 DIAGNOSIS — I10 Essential (primary) hypertension: Secondary | ICD-10-CM | POA: Diagnosis not present

## 2017-05-29 DIAGNOSIS — Z1329 Encounter for screening for other suspected endocrine disorder: Secondary | ICD-10-CM | POA: Diagnosis not present

## 2017-05-29 DIAGNOSIS — Z1231 Encounter for screening mammogram for malignant neoplasm of breast: Secondary | ICD-10-CM

## 2017-05-29 DIAGNOSIS — R635 Abnormal weight gain: Secondary | ICD-10-CM

## 2017-05-29 DIAGNOSIS — Z1383 Encounter for screening for respiratory disorder NEC: Secondary | ICD-10-CM

## 2017-05-29 DIAGNOSIS — N911 Secondary amenorrhea: Secondary | ICD-10-CM

## 2017-05-29 DIAGNOSIS — Z6841 Body Mass Index (BMI) 40.0 and over, adult: Secondary | ICD-10-CM

## 2017-05-29 DIAGNOSIS — Z1389 Encounter for screening for other disorder: Secondary | ICD-10-CM | POA: Diagnosis not present

## 2017-05-29 DIAGNOSIS — Z0000 Encounter for general adult medical examination without abnormal findings: Secondary | ICD-10-CM

## 2017-05-29 DIAGNOSIS — Z1239 Encounter for other screening for malignant neoplasm of breast: Secondary | ICD-10-CM

## 2017-05-29 DIAGNOSIS — R87610 Atypical squamous cells of undetermined significance on cytologic smear of cervix (ASC-US): Secondary | ICD-10-CM | POA: Diagnosis not present

## 2017-05-29 DIAGNOSIS — Z130 Encounter for screening for diseases of the blood and blood-forming organs and certain disorders involving the immune mechanism: Secondary | ICD-10-CM

## 2017-05-29 DIAGNOSIS — Z124 Encounter for screening for malignant neoplasm of cervix: Secondary | ICD-10-CM

## 2017-05-29 LAB — POCT URINALYSIS DIP (MANUAL ENTRY)
BILIRUBIN UA: NEGATIVE mg/dL
Bilirubin, UA: NEGATIVE
GLUCOSE UA: NEGATIVE mg/dL
Nitrite, UA: NEGATIVE
PROTEIN UA: NEGATIVE mg/dL
Urobilinogen, UA: 0.2 E.U./dL
pH, UA: 6.5 (ref 5.0–8.0)

## 2017-05-29 MED ORDER — LEVONORGESTREL-ETHINYL ESTRAD 0.1-20 MG-MCG PO TABS: 1.0000 | ORAL_TABLET | Freq: Every day | ORAL | 0 refills | Status: AC

## 2017-05-29 MED ORDER — ALPRAZOLAM 0.5 MG PO TABS: 0.5000 mg | ORAL_TABLET | Freq: Every evening | ORAL | 1 refills | Status: AC | PRN

## 2017-05-29 MED ORDER — ATENOLOL 50 MG PO TABS: 50.0000 mg | ORAL_TABLET | Freq: Every day | ORAL | 3 refills | Status: DC

## 2017-05-29 NOTE — Patient Instructions (Addendum)
IF you received an x-ray today, you will receive an invoice from [facility]. Please contact [facility] at [phone] with questions or concerns regarding your invoice.   IF you received labwork today, you will receive an invoice from [facility]. Please contact [facility] at [phone] with questions or concerns regarding your invoice.   Our billing staff will not be able to assist you with questions regarding bills from these companies.  You will be contacted with the lab results as soon as they are available. The fastest way to get your results is to activate your My Chart account. Instructions are located on the last page of this paperwork. If you have not heard from [place] regarding the results in 2 weeks, please contact this office.    We recommend that you schedule a mammogram for breast cancer screening. Typically, you do not need a referral to do this. Please contact a local imaging center to schedule your mammogram.  [facility] - [phone]  *ask for the Radiology Department [facility]) - [phone] or [phone]  [facility] - [phone] [facility] - [phone] [facility] - [phone]  *ask for the Radiology Department [facility] - [phone]  *ask for the Radiology Department [facility] - [phone]  *ask for the Mammography Department [facility] - [phone]   Preventing Unhealthy Weight Gain, Adult Staying at a healthy weight is important. When fat builds up in your body, you may become overweight or obese. These conditions put you at greater risk for developing certain health problems, such as heart disease, diabetes, sleeping problems, joint problems, and some cancers. Unhealthy weight gain is often the result of making unhealthy choices in what you eat. It is also a result of not getting enough exercise. You can make  changes to your lifestyle to prevent obesity and stay as healthy as possible. What nutrition changes can be made? To maintain a healthy weight and prevent obesity:  Eat only as much as your body needs. To do this: ? Pay attention to signs that you are hungry or full. Stop eating as soon as you feel full. ? If you feel hungry, try drinking water first. Drink enough water so your urine is clear or pale yellow. ? Eat smaller portions. ? Look at serving sizes on food labels. Most foods contain more than one serving per container. ? Eat the recommended amount of calories for your gender and activity level. While most active people should eat around 2,000 calories per day, if you are trying to lose weight or are not very active, you main need to eat less calories. Talk to your health care provider or dietitian about how many calories you should eat each day.  Choose healthy foods, such as: ? Fruits and vegetables. Try to fill at least half of your plate at each meal with fruits and vegetables. ? Whole grains, such as whole wheat bread, brown rice, and quinoa. ? Lean meats, such as chicken or fish. ? Other healthy proteins, such as beans, eggs, or tofu. ? Healthy fats, such as nuts, seeds, fatty fish, and olive oil. ? Low-fat or fat-free dairy.  Check food labels and avoid food and drinks that: ? Are high in calories. ? Have added sugar. ? Are high in sodium. ? Have saturated fats or trans fats.  Limit how much you eat of the following foods: ? Prepackaged meals. ? Fast  food. ? Foy [place] foods. ? Processed meat, such as bacon, sausage, and deli meats. ? Fatty cuts of red meat and poultry with skin.  Cook foods in healthier ways, such as by baking, broiling, or grilling.  When grocery shopping, try to shop around the outside of the store. This helps you buy mostly fresh foods and avoid canned and prepackaged foods.  What lifestyle changes can be made?  Exercise at least 30 minutes 5 or more  days each week. Exercising includes brisk walking, yard work, biking, running, swimming, and team sports like basketball and soccer. Ask your health care provider which exercises are safe for you.  Do not use any products that contain nicotine or tobacco, such as cigarettes and e-cigarettes. If you need help quitting, ask your health care provider.  Limit alcohol intake to no more than 1 drink a day for nonpregnant women and 2 drinks a day for men. One drink equals 12 oz of beer, 5 oz of wine, or 1 oz of hard liquor.  Try to get 7-9 hours of sleep each night. What other changes can be made?  Keep a food and activity journal to keep track of: ? What you ate and how many calories you had. Remember to count sauces, dressings, and side dishes. ? Whether you were active, and what exercises you did. ? Your calorie, weight, and activity goals.  Check your weight regularly. Track any changes. If you notice you have gained weight, make changes to your diet or activity routine.  Avoid taking weight-loss medicines or supplements. Talk to your health care provider before starting any new medicine or supplement.  Talk to your health care provider before trying any new diet or exercise plan. Why are these changes important? Eating healthy, staying active, and having healthy habits not only help prevent obesity, they also:  Help you to manage stress and emotions.  Help you to connect with friends and family.  Improve your self-esteem.  Improve your sleep.  Prevent long-term health problems.  What can happen if changes are not made? Being obese or overweight can cause you to develop joint or bone problems, which can make it hard for you to stay active or do activities you enjoy. Being obese or overweight also puts stress on your heart and lungs and can lead to health problems like diabetes, heart disease, and some cancers. Where to find more information: Talk with your health care provider or a  dietitian about healthy eating and healthy lifestyle choices. You may also find other information through these resources:  U.S. Department of Agriculture MyPlate: https://ball-collins.biz/www.choosemyplate.gov  American Heart Association: www.heart.org  Centers for Disease Control and Prevention: FootballExhibition.com.brwww.cdc.gov  Summary  Staying at a healthy weight is important. It helps prevent certain diseases and health problems, such as heart disease, diabetes, joint problems, sleep disorders, and some cancers.  Being obese or overweight can cause you to develop joint or bone problems, which can make it hard for you to stay active or do activities you enjoy.  You can prevent unhealthy weight gain by eating a healthy diet, exercising regularly, not smoking, limiting alcohol, and getting enough sleep.  Talk with your health care provider or a dietitian for guidance about healthy eating and healthy lifestyle choices. This information is not intended to replace advice given to you by your health care provider. Make sure you discuss any questions you have with your health care provider. Document Released: 11/05/2016 Document Revised: 12/11/2016 Document Reviewed: 12/11/2016 Elsevier Interactive Patient Education  2018 Elsevier Inc.  

## 2017-05-30 LAB — COMPREHENSIVE METABOLIC PANEL
ALBUMIN: 4 g/dL (ref 3.5–5.5)
ALK PHOS: 93 IU/L (ref 39–117)
ALT: 17 IU/L (ref 0–32)
AST: 17 IU/L (ref 0–40)
Albumin/Globulin Ratio: 1.4 (ref 1.2–2.2)
BILIRUBIN TOTAL: 0.3 mg/dL (ref 0.0–1.2)
BUN / CREAT RATIO: 9 (ref 9–23)
BUN: 8 mg/dL (ref 6–24)
CHLORIDE: 102 mmol/L (ref 96–106)
CO2: 21 mmol/L (ref 20–29)
Calcium: 9.1 mg/dL (ref 8.7–10.2)
Creatinine, Ser: 0.92 mg/dL (ref 0.57–1.00)
GFR calc Af Amer: 85 mL/min/{1.73_m2} (ref >59)
GFR calc non Af Amer: 74 mL/min/{1.73_m2} (ref >59)
GLUCOSE: 91 mg/dL (ref 65–99)
Globulin, Total: 2.8 g/dL (ref 1.5–4.5)
Potassium: 4.4 mmol/L (ref 3.5–5.2)
SODIUM: 141 mmol/L (ref 134–144)
Total Protein: 6.8 g/dL (ref 6.0–8.5)

## 2017-05-30 LAB — CBC
HEMATOCRIT: 42.8 % (ref 34.0–46.6)
HEMOGLOBIN: 14 g/dL (ref 11.1–15.9)
MCH: 28.1 pg (ref 26.6–33.0)
MCHC: 32.7 g/dL (ref 31.5–35.7)
MCV: 86 fL (ref 79–97)
PLATELETS: 339 10*3/uL (ref 150–379)
RBC: 4.98 x10E6/uL (ref 3.77–5.28)
RDW: 14.5 % (ref 12.3–15.4)
WBC: 8.6 10*3/uL (ref 3.4–10.8)

## 2017-05-30 LAB — LIPID PANEL
CHOL/HDL RATIO: 3.7 ratio (ref 0.0–4.4)
Cholesterol, Total: 155 mg/dL (ref 100–199)
HDL: 42 mg/dL (ref >39)
LDL Calculated: 96 mg/dL (ref 0–99)
Triglycerides: 86 mg/dL (ref 0–149)
VLDL CHOLESTEROL CAL: 17 mg/dL (ref 5–40)

## 2017-05-30 LAB — HEMOGLOBIN A1C
Est. average glucose Bld gHb Est-mCnc: 123 mg/dL
Hgb A1c MFr Bld: 5.9 % — ABNORMAL HIGH (ref 4.8–5.6)

## 2017-05-30 LAB — PAP IG W/ RFLX HPV ASCU: PAP Smear Comment: 0

## 2017-05-30 LAB — WET PREP FOR TRICH, YEAST, CLUE
Clue Cell Exam: NEGATIVE
Trichomonas Exam: NEGATIVE
Yeast Exam: NEGATIVE

## 2017-05-30 LAB — TSH: TSH: 3.84 u[IU]/mL (ref 0.450–4.500)

## 2017-05-31 ENCOUNTER — Encounter: Payer: Self-pay | Admitting: Family Medicine

## 2017-06-08 ENCOUNTER — Encounter: Payer: Self-pay | Admitting: Family Medicine

## 2017-06-08 DIAGNOSIS — R7303 Prediabetes: Secondary | ICD-10-CM | POA: Insufficient documentation

## 2017-06-11 LAB — HORMONE PANEL (T4,TSH,FSH,TESTT,SHBG,DHEA,ETC)
DHEA-Sulfate, LCMS: 89 ug/dL
Estradiol, Serum, MS: 14 pg/mL
Estrone Sulfate: 25 ng/dL
FREE T-3: 3.2 pg/mL
FREE TESTOSTERONE, SERUM: 1.4 pg/mL
Follicle Stimulating Hormone: 12 m[IU]/mL
Progesterone, Serum: <10 ng/dL
Sex Hormone Binding Globulin: 119.7 nmol/L
T4: 10 ug/dL
TESTOSTERONE, SERUM (TOTAL): 20 ng/dL
TSH: 4.3 uU/mL
Testosterone-% Free: 0.7 % — ABNORMAL LOW
Triiodothyronine (T-3), Serum: 190 ng/dL — ABNORMAL HIGH

## 2017-06-27 ENCOUNTER — Other Ambulatory Visit: Payer: Self-pay | Admitting: Family Medicine

## 2017-08-28 ENCOUNTER — Ambulatory Visit: Payer: 59 | Admitting: Family Medicine

## 2017-12-13 ENCOUNTER — Other Ambulatory Visit: Payer: Self-pay | Admitting: Family Medicine

## 2017-12-15 NOTE — Telephone Encounter (Signed)
Request for controlled substance; last office visit 05/29/17; pharmacy verified

## 2018-07-02 ENCOUNTER — Other Ambulatory Visit: Payer: Self-pay | Admitting: Family Medicine

## 2018-07-02 NOTE — Telephone Encounter (Signed)
Refill request for atenolol 50 mg #f30  0 refills approved.  No further refills without appt.  Will send to schedulers to call pt and schedule f/u meds/labs with shaw. Dgaddy, CMA

## 2018-07-02 NOTE — Telephone Encounter (Signed)
Atenolol 50 mg refill Last Refill:05/29/17 # 90 Last OV: 05/29/17  PCP: Clelia [facility]Shaw [facility] [phone]

## 2018-07-28 ENCOUNTER — Other Ambulatory Visit: Payer: Self-pay | Admitting: Family Medicine

## 2018-07-28 NOTE — Telephone Encounter (Signed)
Attempted to call patient regarding needing an ov for refills. No answer, left message for pt to return call and schedule an appointment.

## 2018-08-16 ENCOUNTER — Other Ambulatory Visit: Payer: Self-pay | Admitting: Family Medicine

## 2018-08-17 NOTE — Telephone Encounter (Signed)
Pt upset that medication cannot be refilled.  Would like medication to be refilled without ongoing appts.  Would like MD response for best method to titrate down/off of Atenolol.  Is not able to take her BP at home or pharmacy.  Cannot afford to come in for OV at this time.   Reviewed our policy requiring pt be seen within 12 months for refills is for the safety of the patient and to ensure that she is on medication appropriate for her diagnosis.  Pt stated that she has lost her job and her insurance.  Reviewed cost of appt and self-pay options.  Pt states she cannot afford appt.  C/b to pt and l/m with community resources that provide low-fee appointments [facility] as well as [facility]).

## 2018-08-17 NOTE — Telephone Encounter (Signed)
I talked with pt and stated that we are unable to refill her b/p medication tenormin without her coming for an OV. Pt was not happy with my reasons, Transfer to [employer].

## 2018-08-17 NOTE — Telephone Encounter (Signed)
Pt calling back.  States she is unemployed and she can't afford to come in for a visit.  Pt wants to know if this can be refilled as  A 90 day supply until she gets a job and can afford a visit again.  Pt states she is requesting 90 day supply because it is cheaper than a 30 day supply.  Pt states if this can't be done she wants to know what risks of not taking medication are. Pt can be reached at [phone].

## 2018-08-17 NOTE — Telephone Encounter (Signed)
Called patient and left VM

## 2018-08-17 NOTE — Telephone Encounter (Signed)
Patient called, left VM to return call to the office to schedule an annual physical with Dr. Clelia Croft, advised unable to refill medications until she's seen in the office. Last CPE 05/29/17.

## 2018-08-18 ENCOUNTER — Encounter: Payer: Self-pay | Admitting: Family Medicine

## 2018-08-18 MED ORDER — ATENOLOL 50 MG PO TABS: 50.0000 mg | ORAL_TABLET | Freq: Every day | ORAL | 3 refills | Status: AC

## 2018-08-18 NOTE — Addendum Note (Signed)
Addended by: Sherren Mocha on: 08/18/2018 02:21 AM   Modules accepted: Orders

## 2023-10-15 ENCOUNTER — Other Ambulatory Visit: Payer: Self-pay | Admitting: Internal Medicine

## 2023-10-15 DIAGNOSIS — Z1231 Encounter for screening mammogram for malignant neoplasm of breast: Secondary | ICD-10-CM

## 2024-01-12 ENCOUNTER — Other Ambulatory Visit: Payer: Self-pay | Admitting: Family Medicine

## 2024-01-12 ENCOUNTER — Ambulatory Visit
Admission: RE | Admit: 2024-01-12 | Discharge: 2024-01-12 | Disposition: A | Discharge status: Home / Self Care | Payer: No Typology Code available for payment source | Source: Ambulatory Visit | Attending: Internal Medicine | Admitting: Internal Medicine

## 2024-01-12 ENCOUNTER — Other Ambulatory Visit: Payer: Self-pay

## 2024-01-12 DIAGNOSIS — Z1231 Encounter for screening mammogram for malignant neoplasm of breast: Secondary | ICD-10-CM
